# Patient Record
Sex: Female | Born: 1980 | Hispanic: No | Marital: Single | Smoking: Former smoker
Health system: Southern US, Community
[De-identification: ages and names within clinical notes are randomized; demographics above are authoritative.]

## PROBLEM LIST (undated history)

## (undated) ENCOUNTER — Inpatient Hospital Stay (HOSPITAL_COMMUNITY): Payer: Self-pay

## (undated) DIAGNOSIS — G43909 Migraine, unspecified, not intractable, without status migrainosus: Secondary | ICD-10-CM

## (undated) DIAGNOSIS — F419 Anxiety disorder, unspecified: Secondary | ICD-10-CM

## (undated) DIAGNOSIS — O009 Unspecified ectopic pregnancy without intrauterine pregnancy: Secondary | ICD-10-CM

## (undated) HISTORY — DX: Anxiety disorder, unspecified: F41.9

## (undated) HISTORY — PX: TONSILLECTOMY: SUR1361

## (undated) HISTORY — DX: Unspecified ectopic pregnancy without intrauterine pregnancy: O00.90

## (undated) HISTORY — DX: Migraine, unspecified, not intractable, without status migrainosus: G43.909

---

## 2002-01-08 ENCOUNTER — Emergency Department (HOSPITAL_COMMUNITY): Admission: EM | Admit: 2002-01-08 | Discharge: 2002-01-08 | Payer: Self-pay | Admitting: *Deleted

## 2002-01-08 ENCOUNTER — Encounter: Payer: Self-pay | Admitting: Emergency Medicine

## 2003-05-08 ENCOUNTER — Emergency Department (HOSPITAL_COMMUNITY): Admission: EM | Admit: 2003-05-08 | Discharge: 2003-05-08 | Payer: Self-pay | Admitting: Emergency Medicine

## 2003-05-12 ENCOUNTER — Encounter: Payer: Self-pay | Admitting: Emergency Medicine

## 2003-05-12 ENCOUNTER — Emergency Department (HOSPITAL_COMMUNITY): Admission: EM | Admit: 2003-05-12 | Discharge: 2003-05-12 | Payer: Self-pay | Admitting: Emergency Medicine

## 2004-08-21 ENCOUNTER — Ambulatory Visit (HOSPITAL_COMMUNITY): Admission: RE | Admit: 2004-08-21 | Discharge: 2004-08-21 | Payer: Self-pay | Admitting: Obstetrics and Gynecology

## 2005-08-29 ENCOUNTER — Ambulatory Visit: Payer: Self-pay | Admitting: Internal Medicine

## 2005-08-30 ENCOUNTER — Ambulatory Visit: Payer: Self-pay | Admitting: Internal Medicine

## 2005-08-30 ENCOUNTER — Ambulatory Visit (HOSPITAL_COMMUNITY): Admission: RE | Admit: 2005-08-30 | Discharge: 2005-08-30 | Payer: Self-pay | Admitting: Internal Medicine

## 2005-09-24 ENCOUNTER — Ambulatory Visit: Payer: Self-pay | Admitting: Internal Medicine

## 2005-09-26 ENCOUNTER — Ambulatory Visit (HOSPITAL_COMMUNITY): Admission: RE | Admit: 2005-09-26 | Discharge: 2005-09-26 | Payer: Self-pay | Admitting: Internal Medicine

## 2007-11-04 ENCOUNTER — Other Ambulatory Visit: Admission: RE | Admit: 2007-11-04 | Discharge: 2007-11-04 | Payer: Self-pay | Admitting: Obstetrics and Gynecology

## 2007-12-08 ENCOUNTER — Encounter (INDEPENDENT_AMBULATORY_CARE_PROVIDER_SITE_OTHER): Payer: Self-pay | Admitting: Otolaryngology

## 2007-12-08 ENCOUNTER — Ambulatory Visit (HOSPITAL_COMMUNITY): Admission: RE | Admit: 2007-12-08 | Discharge: 2007-12-08 | Payer: Self-pay | Admitting: Otolaryngology

## 2007-12-16 ENCOUNTER — Observation Stay (HOSPITAL_COMMUNITY): Admission: EM | Admit: 2007-12-16 | Discharge: 2007-12-17 | Payer: Self-pay | Admitting: Emergency Medicine

## 2008-06-22 ENCOUNTER — Emergency Department (HOSPITAL_COMMUNITY): Admission: EM | Admit: 2008-06-22 | Discharge: 2008-06-22 | Payer: Self-pay | Admitting: Emergency Medicine

## 2008-07-05 ENCOUNTER — Ambulatory Visit (HOSPITAL_COMMUNITY): Admission: RE | Admit: 2008-07-05 | Discharge: 2008-07-05 | Payer: Self-pay | Admitting: Pulmonary Disease

## 2008-07-08 ENCOUNTER — Encounter (HOSPITAL_COMMUNITY): Admission: RE | Admit: 2008-07-08 | Discharge: 2008-07-29 | Payer: Self-pay | Admitting: Pulmonary Disease

## 2008-07-14 ENCOUNTER — Encounter: Admission: RE | Admit: 2008-07-14 | Discharge: 2008-08-02 | Payer: Self-pay | Admitting: Pulmonary Disease

## 2008-08-03 ENCOUNTER — Encounter (HOSPITAL_COMMUNITY): Admission: RE | Admit: 2008-08-03 | Discharge: 2008-08-20 | Payer: Self-pay | Admitting: Pulmonary Disease

## 2008-11-30 ENCOUNTER — Other Ambulatory Visit: Admission: RE | Admit: 2008-11-30 | Discharge: 2008-11-30 | Payer: Self-pay | Admitting: Obstetrics and Gynecology

## 2009-03-16 ENCOUNTER — Ambulatory Visit (HOSPITAL_COMMUNITY): Payer: Self-pay | Admitting: Psychiatry
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
## 2010-01-11 ENCOUNTER — Emergency Department (HOSPITAL_COMMUNITY): Admission: EM | Admit: 2010-01-11 | Discharge: 2010-01-11 | Payer: Self-pay | Admitting: Family Medicine

## 2010-07-10 ENCOUNTER — Other Ambulatory Visit
Admission: RE | Admit: 2010-07-10 | Discharge: 2010-07-10 | Payer: Self-pay | Source: Home / Self Care | Admitting: Obstetrics and Gynecology

## 2010-12-12 NOTE — H&P (Signed)
NAME:  Monique Terry, Monique Terry                ACCOUNT NO.:  [id number]   MEDICAL RECORD NO.:  [id number]          PATIENT TYPE:  AMB   LOCATION:  SDS                          FACILITY:  [facility]   PHYSICIAN:  Hermelinda Medicus, M.D.   DATE OF BIRTH:  [birth year]   DATE OF ADMISSION:  12/08/2007  DATE OF DISCHARGE:                              HISTORY & PHYSICAL   HISTORY:  This patient is a 30 year old female who works at [employer].  She has been in people's homes.  She has had tonsillitis on several  occasions.  She has had tonsillitis times five in 2007 and times three  in 2008 and the cephalosporins and usual antibiotics worked very poorly  for her.  She now needs Levaquin to get this resolved and continues to  have difficulties with her tonsils with exudate and persistent sore  throats.  She now enters for a tonsillectomy under general endotracheal  anesthesia.  She has also been evaluated for Monospot, which has been  negative.  She also has an allergy to SULFA.  Otherwise, her general  medical health is excellent.  She has no cardiovascular, blood pressure,  bowel, bladder, or respiratory problems.   PHYSICAL EXAMINATION:  VITAL SIGNS:  Reveals a blood pressure of 118/81  with a pulse of 80.  HEENT:  The ears are clear.  The tympanic membranes are clear.  The oral  cavity shows considerable exudative tonsillitis.  The nasopharynx and  oropharynx are clear.  The larynx is clear.  True cords, false cords,  epiglottis, and base of tongue are clear.  True cord mobility, gag  reflex, __________ , EOMs, facial nerve are all symmetrical.  CHEST:  Clear.  No rales, rhonchi, or wheezes.  CARDIOVASCULAR:  __________  murmurs or gallops.   INITIAL DIAGNOSES:  Tonsillitis, history of working in healthcare,  history of exposure, and history of poor antibiotic response.           ______________________________  Hermelinda Medicus, M.D.     JC/MEDQ  D:  12/08/2007  T:  12/08/2007  Job:  [id number]   cc:    Ramon Dredge L. Juanetta Gosling, M.D.

## 2010-12-12 NOTE — Op Note (Signed)
NAME:  SAADIYA, WILFONG                ACCOUNT NO.:  [id number]   MEDICAL RECORD NO.:  [id number]          PATIENT TYPE:  OBV   LOCATION:  5151                         FACILITY:  [facility]   PHYSICIAN:  Hermelinda Medicus, M.D.   DATE OF BIRTH:  [birth year]   DATE OF PROCEDURE:  DATE OF DISCHARGE:                               OPERATIVE REPORT   PREOPERATIVE DIAGNOSIS:  Left superior 7days post tonsillectomy  tonsillar bleed.   POSTOPERATIVE DIAGNOSIS:  Left superior 7 days post tonsillectomy  tonsillar bleed.   OPERATION:  Evaluation of tonsillar bleed and electrocoagulation.  Cauterization of this tonsillar bleeder.   OPERATOR:  Hermelinda Medicus, MD   ANESTHESIA:  General endotracheal with Dr. Bedelia Person.   PROCEDURE:  The patient was placed in supine position.  Under general  endotracheal anesthesia, we did have a window of exposure, where there  was only minimal bleeding, and the patient's stomach was clear.  She was  intubated without difficulty, and no problem with aspiration.  Once she  was intubated, the patient was prepped and draped, and the tonsillar gag  was placed using the [facility] mouth gag, and the right tonsillar bed was  carefully evaluated and found to be in good condition.  The left  tonsillar bed inferiorly was perfectly normal, but on the superior  aspect of the tonsillar bed on the left, she had some clotted blood.  This was removed then revealing a bleeder that we grasped using a  tonsillar tenaculum and cauterized this using Bovie coagulation.  There  was no other bleeding issue.  Once that was completed, the remainder of  the tonsillar region was all cleared.  The patient's mouth was  irrigated. The stomach was again suctioned, and the patient was awakened  and tolerated the procedure well and was doing well postoperatively.  I  will keep her on 23-hour observation here in the hospital and then we  will follow her again in 5 days and 10 days, and 3 weeks and 6  weeks.           ______________________________  Hermelinda Medicus, M.D.     JC/MEDQ  D:  12/16/2007  T:  12/17/2007  Job:  [id number]   cc:   Ramon Dredge L. Juanetta Gosling, M.D.

## 2010-12-12 NOTE — H&P (Signed)
Monique Terry, Monique Terry                ACCOUNT NO.:  [id number]   MEDICAL RECORD NO.:  [id number]          PATIENT TYPE:  OBV   LOCATION:  5151                         FACILITY:  [facility]   PHYSICIAN:  Hermelinda Medicus, M.D.   DATE OF BIRTH:  1981/04/11   DATE OF ADMISSION:  12/16/2007  DATE OF DISCHARGE:                              HISTORY & PHYSICAL   HISTORY OF PRESENT ILLNESS:  This patient is a 30 year old female who  had had 5 episodes of tonsillitis in 2007 and has had 3 episodes of in  2008 and continues to have these difficulties with tonsillitis.  She  works for [employer] and is in people's homes and is exposed to  considerable amount of infections and bacteria.  She has exudative  tonsils and underwent a tonsillectomy on Dec 08, 2007, which was  uneventful.  She did very well and did well during and after her surgery  and was kept as an outpatient.  However, today, she apparently ate some  one time soup with some beef pieces in it and some ice cream and  suddenly had some bleeding from her tonsillar bed.  She went to the  emergency room and was noted that she had considerable bleeding from the  left superior pole region at [facility].  She was immediately  sent down to [facility] and immediately taken to the operating room  as she had considerable bleeding where she vomited up the food that she  had taken and also felt that she probably lost approximately 500 mL.   Her past history is quite unremarkable.   ALLERGIES:  SHE IS ALLERGIC TO SULFA.   She was evaluated in the past because she had severe tonsillitis.  Monospot was, however, negative and she is generally in good health.  Had no cardiovascular, blood pressure, bowel or bladder, or respiratory  problems.   PHYSICAL EXAMINATION:  VITAL SIGNS:  Her blood pressure was 120/70 when  she was here with a pulse of 140.  When she was up at [facility] she did  have an elevated blood pressure probably secondary to  her anxiety of  146/90.  GENERAL:  Oral cavity shows some bleeding.  She was spitting out some  blood, not a great deal, but she did have some that shows that she had  spit out.  HEENT:  Nose is clear.  Oral cavity is otherwise clear.  Her ears are  clear.  CHEST:  Clear.  No rales, rhonchi, or wheezes.  CARDIOVASCULAR:  Normal sinus rhythm.  No murmurs, rubs, or gallops.  ABDOMEN:  Unremarkable.  EXTREMITIES:  Unremarkable.   INITIAL DIAGNOSIS:  Status post tonsillectomy or tonsillitis with a left  superior pole tonsillar bleed.   PLAN:  Our plan is to go to the operating room and evaluate and correct  this tonsillar bleeding.           ______________________________  Hermelinda Medicus, M.D.     JC/MEDQ  D:  12/16/2007  T:  12/17/2007  Job:  [id number]   cc:   Ramon Dredge L. Juanetta Gosling, M.D.

## 2010-12-12 NOTE — Op Note (Signed)
NAME:  Monique Terry, Monique Terry                ACCOUNT NO.:  [id number]   MEDICAL RECORD NO.:  [id number]          PATIENT TYPE:  AMB   LOCATION:  SDS                          FACILITY:  [facility]   PHYSICIAN:  Hermelinda Medicus, M.D.   DATE OF BIRTH:  02-Oct-1980   DATE OF PROCEDURE:  DATE OF DISCHARGE:                               OPERATIVE REPORT   PREOPERATIVE DIAGNOSIS:  Tonsillitis.   POSTOPERATIVE DIAGNOSIS:  Tonsillitis.   OPERATION:  Tonsillectomy.   ANESTHESIA:  General endotracheal with Dr. Jean Rosenthal.   SURGEON:  Hermelinda Medicus, MD   PROCEDURE:  The patient was placed in supine position under general  endotracheal anesthesia.  The tonsils were removed using the Bovie  electrocoagulation and blunt dissection.  The tonsils were found to be  exudative and filled with purulent material.  The patient has been on  antibiotics and is IV antibiotics at this time.  Once the tonsils were  removed, all hemostasis was established with Bovie electrocoagulation.  Stomach was suctioned with the gag that was used with the [facility], and once the gag was slowly released, tonsillar beds were  again checked and found to be completely clear.   TOTAL BLOOD LOSS:  Estimated 20 mL.   FOLLOW-UP:  Her follow-up will be in 5 days then 10 days then 3 weeks,  and she is aware that she cannot travel for long distances for at least  10 days to 2 weeks.  She has to be on a soft bland diet for the same  amount of time.  The patient tolerated the procedure well and was doing  well postop.           ______________________________  Hermelinda Medicus, M.D.     JC/MEDQ  D:  12/08/2007  T:  12/08/2007  Job:  [id number]   cc:   Ramon Dredge L. Juanetta Gosling, M.D.

## 2010-12-15 NOTE — Consult Note (Signed)
NAME:  POPP, Makia                ACCOUNT NO.:  [id number]   MEDICAL RECORD NO.:  [id number]          PATIENT TYPE:  AMB   LOCATION:                                FACILITY:  [facility]   PHYSICIAN:  R. Roetta Sessions, M.D. DATE OF BIRTH:  [birth year]   DATE OF CONSULTATION:  DATE OF DISCHARGE:                                   CONSULTATION   REASON FOR CONSULTATION:  Chronic vomiting.   HISTORY OF PRESENT ILLNESS:  Mrs. Monique Terry is a 30 year old Caucasian female who  reports a 12-year history of intermittent chronic vomiting.  She states  approximately three months ago she had very frequent episodes of vomiting  without warning.  During the month of November and December, about 2/3 of  the month, she had a daily vomiting episode.  She also complains of a  globus sensation in the back of her throat.  She complains of post nasal  drip as well as sinus pressure.  She denies any symptoms of heartburn or  indigestion.  She does have rare water brash, especially when she eats out.  She denies any nausea prior to the vomiting.  Generally she vomits clear,  foamy emesis initially and is followed by 2-3 episodes of bilious material.  She denies any dysphagia or odynophagia.  Denies any anorexia or early  satiety.  She has had a one month trial of Prevacid 30 mg daily which did  not resolve her symptoms.  She has normal soft, brown bowel movements once  or twice a day.  Denies any rectal bleeding or melena.  Denies any history  of constipation or diarrhea.   PAST MEDICAL HISTORY:  Denies any.   CURRENT MEDICATIONS:  1.  [place] BC 777 once daily.  2.  One-A-Day multivitamin daily.  3.  Folic acid daily.   ALLERGIES:  SULFA.   FAMILY HISTORY:  Positive for paternal grandfather with colon carcinoma  diagnosed in his late 87's.  She has no first degree history.  Father, age  10, has history of coronary disease.  Mother is age 77 and is healthy.  She  has one healthy brother.   SOCIAL HISTORY:  Mrs.  Monique Terry has been married for two years.  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  She has a four year  history of smoking about half a pack a day.  She consumes about 8 beers a  month.  Denies any drug use.   REVIEW OF SYSTEMS:  CONSTITUTIONAL:  Weight stable.  Denies any fever or  chills.  Denies any fatigue.  Denies any insomnia.  HEENT:  She does report  post nasal drip as well as sinus pressure and headache.  CARDIOVASCULAR:  Denies any chest pain or palpitations. PULMONOLOGY:  Denies any cough,  shortness of breath, dyspnea or hemoptysis.  GI:  See HPI.  GYN:  Her last  menstrual period was approximately 28 days ago.  She does have regular  cycles on birth control pills.  GU:  She denies any dysuria, hematuria or  increased urinary frequency.  NEUROLOGICAL:  She does have frequent  headaches that are temporal and occipital in nature.  She can have headaches  that last a couple of days followed by a month without headaches.  She  denies any visual changes or aura.  She does have some nausea with her  headaches, but generally does not vomiting.  She denies any weakness or  paresthesias.   PHYSICAL EXAMINATION:  VITAL SIGNS:  Weight 204 pounds, height 63-1/2  inches, temperature 98.8, blood pressure 110/76, pulse 92.  GENERAL APPEARANCE:  Mrs. Monique Terry is a 30 year old, obese, Caucasian female who  is alert, oriented, pleasant and cooperative in no acute distress.  HEENT:  Sclerae are clear, nonicteric.  Conjunctivae are pink.  Oropharynx  pink and moist without any lesions.  NECK:  Supple without any mass or thyromegaly.  CHEST:  Heart regular rate and rhythm with normal S1, S2 without any  murmurs, clicks, rubs or gallops.  LUNGS:  Clear to auscultation bilaterally.  ABDOMEN:  Protuberant with positive bowel sounds x4.  She does have an  umbilical ornament.  Abdomen soft, nontender, nondistended with no palpable  mass or hepatosplenomegaly.  No rebound tenderness  or guarding.  RECTAL:  No external lesions visualized.  Good sphincter tone.  No internal  masses palpated.  Small amount of light brown stool was obtained in the  vault.  It was hemoccult negative.  EXTREMITIES:  Without clubbing or edema bilaterally.  SKIN:  Pink, warm and dry without any rash or jaundice.   IMPRESSION:  Mrs. Monique Terry is a 30 year old Caucasian female with a 12-year  history of chronic intermittent vomiting.  Her symptoms were worse around  the holiday.  She reports rare water brash, but denies any chronic heartburn  or indigestion.  She does have a globus sensation as well as a significant  amount of post nasal drip.  She generally has no forewarning prior to emesis  which is generally clear and foamy to begin with followed by bilious  material.  Mrs. Monique Terry may have silent reflux, although, trial of PPI did not  make much difference.  She could also have peptic ulcer disease, and  therefore, I feel she needs further evaluation of her upper GI tract.  She  also notes a significant amount of sinus pressure and post nasal drip which  is associated with her vomiting, so this could very well be related to  allergic rhinitis as well.   PLAN:  1.  We will proceed with EGD with Dr. Jena Gauss in the near future.  I have      discussed the procedure including risks and benefits which include but      are not limited to bleeding, infection, perforation, drug reaction.  She      agrees with plan and consent will be obtained.  2.  Trial of Allegra 180 mg daily #30 with two refills.  3.  Further recommendations pending procedure.   I would like to thank Dr. Juanetta Gosling for allowing [place] to participate in the care  of Mrs. Popp.      Nicholas Lose, N.P.      Jonathon Bellows, M.D.  Electronically Signed    KC/MEDQ  D:  08/29/2005  T:  08/29/2005  Job:  [id number]   cc:   Ramon Dredge L. Juanetta Gosling, M.D.  Fax: [phone]

## 2011-04-25 LAB — DIFFERENTIAL
Basophils Absolute: 0.1
Basophils Relative: 1
Eosinophils Absolute: 0.3
Eosinophils Relative: 2
Lymphocytes Relative: 27
Lymphs Abs: 3.3
Monocytes Absolute: 0.8
Monocytes Relative: 7
Neutro Abs: 7.9 — ABNORMAL HIGH
Neutrophils Relative %: 64

## 2011-04-25 LAB — CBC
HCT: 37.3
Hemoglobin: 13.4
MCHC: 35.8
MCV: 88.1
Platelets: 317
RBC: 4.24
RDW: 12.6
WBC: 12.3 — ABNORMAL HIGH

## 2011-07-13 ENCOUNTER — Other Ambulatory Visit (HOSPITAL_COMMUNITY)
Admission: RE | Admit: 2011-07-13 | Discharge: 2011-07-13 | Disposition: A | Discharge status: Home / Self Care | Payer: BC Managed Care – PPO | Source: Ambulatory Visit | Attending: Obstetrics & Gynecology | Admitting: Obstetrics & Gynecology

## 2011-07-13 ENCOUNTER — Other Ambulatory Visit: Payer: Self-pay | Admitting: Obstetrics & Gynecology

## 2011-07-13 DIAGNOSIS — Z01419 Encounter for gynecological examination (general) (routine) without abnormal findings: Secondary | ICD-10-CM | POA: Insufficient documentation

## 2016-06-05 ENCOUNTER — Encounter (HOSPITAL_COMMUNITY): Payer: Self-pay | Admitting: Emergency Medicine

## 2016-06-05 ENCOUNTER — Emergency Department (HOSPITAL_COMMUNITY)
Admission: EM | Admit: 2016-06-05 | Discharge: 2016-06-05 | Disposition: A | Discharge status: Home / Self Care | Payer: Self-pay | Attending: Emergency Medicine | Admitting: Emergency Medicine

## 2016-06-05 DIAGNOSIS — R21 Rash and other nonspecific skin eruption: Secondary | ICD-10-CM | POA: Insufficient documentation

## 2016-06-05 DIAGNOSIS — Z79899 Other long term (current) drug therapy: Secondary | ICD-10-CM | POA: Insufficient documentation

## 2016-06-05 DIAGNOSIS — F17210 Nicotine dependence, cigarettes, uncomplicated: Secondary | ICD-10-CM | POA: Insufficient documentation

## 2016-06-05 MED ORDER — PREDNISONE 10 MG (21) PO TBPK: 10.0000 mg | ORAL_TABLET | Freq: Every day | ORAL | 0 refills | Status: DC

## 2016-06-05 MED ORDER — HYDROXYZINE HCL 10 MG PO TABS: 10.0000 mg | ORAL_TABLET | Freq: Four times a day (QID) | ORAL | 0 refills | Status: DC | PRN

## 2016-06-05 MED ORDER — TRIAMCINOLONE ACETONIDE 0.1 % EX CREA: 1.0000 "application " | TOPICAL_CREAM | Freq: Two times a day (BID) | CUTANEOUS | 1 refills | Status: DC

## 2016-06-05 NOTE — ED Provider Notes (Signed)
[facility]-EMERGENCY DEPT Provider Note   CSN: [id number] Arrival date & time: 06/05/16  1754     History   Chief Complaint Chief Complaint  Patient presents with  . Rash    HPI Monique Terry is a 35 y.o. female.  Monique Terry is a 35 y.o. Female who presents to the ED with concern for a poison oak rash. Patient reports 2 weeks ago she started cutting down some trees that had some poison oak around them. She started to develop a rash and used poison oak soap. She then finished cutting on the trees again 1 week ago and noticed more of her rash. She reports the rash had linear streaks to her bilateral arms. She also noticed some new areas that gradually started days later. She reports today she noted a red area that's itchy to her right eyebrow and to her left neck. No eye involvement. She does not wear contacts or glasses.  She reports she's been using poison ivy soap and calamine lotion with little relief. She reports the rash is itchy. She reports the rash to her arms is improving. She denies fevers, tongue swelling, lip swelling, trouble breathing, discharge, abdominal pain, or other rashes.    The history is provided by the patient. No language interpreter was used.  Rash      History reviewed. No pertinent past medical history.  There are no active problems to display for this patient.   Past Surgical History:  Procedure Laterality Date  . TONSILLECTOMY      OB History    Gravida Para Term Preterm AB Living   0 0 0 0 0 0   SAB TAB Ectopic Multiple Live Births   0 0 0 0 0       Home Medications    Prior to Admission medications   Medication Sig Start Date End Date Taking? Authorizing Provider  hydrOXYzine (ATARAX/VISTARIL) 10 MG tablet Take 1 tablet (10 mg total) by mouth every 6 (six) hours as needed for itching. 06/05/16   Everlene Farrier, PA-C  predniSONE (STERAPRED UNI-PAK 21 TAB) 10 MG (21) TBPK tablet Take 1 tablet (10 mg total) by mouth daily. Take 6 tabs  by mouth daily  for 2 days, then 5 tabs for 2 days, then 4 tabs for 2 days, then 3 tabs for 2 days, 2 tabs for 2 days, then 1 tab by mouth daily for 2 days 06/05/16   Everlene Farrier, PA-C  triamcinolone cream (KENALOG) 0.1 % Apply 1 application topically 2 (two) times daily. 06/05/16   Everlene Farrier, PA-C    Family History History reviewed. No pertinent family history.  Social History Social History  Substance Use Topics  . Smoking status: Current Every Day Smoker    Packs/day: 0.50    Types: Cigarettes  . Smokeless tobacco: Never Used  . Alcohol use Yes     Comment: occ     Allergies   Sulfa antibiotics   Review of Systems Review of Systems  Constitutional: Negative for chills and fever.  HENT: Negative for congestion, ear pain, facial swelling, rhinorrhea, sore throat and trouble swallowing.   Eyes: Negative for pain and visual disturbance.  Respiratory: Negative for cough and shortness of breath.   Gastrointestinal: Negative for nausea and vomiting.  Musculoskeletal: Negative for myalgias.  Skin: Positive for rash.     Physical Exam Updated Vital Signs BP 137/80   Pulse 79   Temp 98.6 F (37 C) (Oral)   Resp 16  Ht 5\' 3"  (1.6 m)   Wt 78 kg   LMP 05/08/2016   SpO2 100%   BMI 30.47 kg/m   Physical Exam  Constitutional: She is oriented to person, place, and time. She appears well-developed and well-nourished. No distress.  Nontoxic appearing.  HENT:  Head: Normocephalic and atraumatic.  Right Ear: External ear normal.  Left Ear: External ear normal.  Mouth/Throat: Oropharynx is clear and moist. No oropharyngeal exudate.  Slight area of erythema to her right eyebrow. No vesicles or bulla.  No tongue or lip swelling. Throat is clear.  Bilateral tympanic membranes are pearly-gray without erythema or loss of landmarks.   Eyes: Conjunctivae and EOM are normal. Pupils are equal, round, and reactive to light. Right eye exhibits no discharge. Left eye exhibits no  discharge.  Neck: Normal range of motion. Neck supple. No JVD present. No tracheal deviation present.  Cardiovascular: Normal rate, regular rhythm and intact distal pulses.   Pulmonary/Chest: Effort normal. No stridor. No respiratory distress.  Lymphadenopathy:    She has no cervical adenopathy.  Neurological: She is alert and oriented to person, place, and time. Coordination normal.  Skin: Skin is warm and dry. Capillary refill takes less than 2 seconds. Rash noted. She is not diaphoretic. There is erythema. No pallor.  Slight area of erythema noted to her bilateral arms. Some with linear marks. No vesicles or bulla. No evidence of secondary infection. No discharge.   Psychiatric: She has a normal mood and affect. Her behavior is normal.  Nursing note and vitals reviewed.    ED Treatments / Results  Labs (all labs ordered are listed, but only abnormal results are displayed) Labs Reviewed - No data to display  EKG  EKG Interpretation None       Radiology No results found.  Procedures Procedures (including critical care time)  Medications Ordered in ED Medications - No data to display   Initial Impression / Assessment and Plan / ED Course  I have reviewed the triage vital signs and the nursing notes.  Pertinent labs & imaging results that were available during my care of the patient were reviewed by me and considered in my medical decision making (see chart for details).  Clinical Course    This is a 35 y.o. Female who presents to the ED with concern for a poison oak rash. Patient reports 2 weeks ago she started cutting down some trees that had some poison oak around them. She started to develop a rash and used poison oak soap. She then finished cutting on the trees again 1 week ago and noticed more of her rash. She reports the rash had linear streaks to her bilateral arms. She also noticed some new areas that gradually started days later. She reports today she noted a red  area that's itchy to her right eyebrow and to her left neck. No eye involvement. She does not wear contacts or glasses.  She reports she's been using poison ivy soap and calamine lotion with little relief. She reports the rash is itchy.  On exam the patient is afebrile and non-toxic appearing. She has a slight area of erythema to her right eyebrow where it appears she was scratching the area. No eye involvement. There is also some slight erythema noted to her bilateral arms some in a linear pattern. No evidence of infection. No evidence of abscess or cellulitis.  Patient does describe a history that is convincing for possible poison oak exposure and rash. I discussed the  expected course and treatment of poison oak. Will start on steroid cream, steroid taper and Atarax for itching. I discussed return precautions. I advised the patient to follow-up with their primary care provider this week. I advised the patient to return to the emergency department with new or worsening symptoms or new concerns. The patient verbalized understanding and agreement with plan.    Final Clinical Impressions(s) / ED Diagnoses   Final diagnoses:  Rash and nonspecific skin eruption    New Prescriptions New Prescriptions   HYDROXYZINE (ATARAX/VISTARIL) 10 MG TABLET    Take 1 tablet (10 mg total) by mouth every 6 (six) hours as needed for itching.   PREDNISONE (STERAPRED UNI-PAK 21 TAB) 10 MG (21) TBPK TABLET    Take 1 tablet (10 mg total) by mouth daily. Take 6 tabs by mouth daily  for 2 days, then 5 tabs for 2 days, then 4 tabs for 2 days, then 3 tabs for 2 days, 2 tabs for 2 days, then 1 tab by mouth daily for 2 days   TRIAMCINOLONE CREAM (KENALOG) 0.1 %    Apply 1 application topically 2 (two) times daily.     Everlene FarrierWilliam Eloyce Bultman, PA-C 06/05/16 1909    Mancel BaleElliott Wentz, MD 06/06/16 [phone]

## 2016-06-05 NOTE — ED Triage Notes (Signed)
PT c/o rash to face, both arms, neck and back x2 weeks and states it started after cutting down a tree that may have had poison oak on it. PT states no relief from OTC soap for poison ivy.

## 2017-05-29 ENCOUNTER — Telehealth: Payer: Self-pay | Admitting: *Deleted

## 2017-05-29 ENCOUNTER — Telehealth: Payer: Self-pay | Admitting: Obstetrics & Gynecology

## 2017-05-29 NOTE — Telephone Encounter (Signed)
Informed patient that dosage for mag oxide 400-600mg  daily. Verbalized understanding.

## 2017-05-29 NOTE — Telephone Encounter (Signed)
Patient called stating she has a history of migraines and has taken extra strength Tylenol with no relief. She has also tried a Coke per day as well. Encouraged to try eating small frequent meals and push fluids. Also could try CoQ10 100mg  3 times daily, Vit B2 400mg  daily and Mag Oxided 400-600mg  daily. Pt stated she would try these suggestions.

## 2017-05-30 ENCOUNTER — Other Ambulatory Visit: Payer: Self-pay | Admitting: Obstetrics and Gynecology

## 2017-05-30 DIAGNOSIS — O3680X0 Pregnancy with inconclusive fetal viability, not applicable or unspecified: Secondary | ICD-10-CM

## 2017-05-31 ENCOUNTER — Encounter: Payer: Self-pay | Admitting: Obstetrics and Gynecology

## 2017-05-31 ENCOUNTER — Other Ambulatory Visit: Payer: Self-pay | Admitting: Obstetrics and Gynecology

## 2017-05-31 ENCOUNTER — Encounter (INDEPENDENT_AMBULATORY_CARE_PROVIDER_SITE_OTHER): Payer: Self-pay

## 2017-05-31 ENCOUNTER — Ambulatory Visit (INDEPENDENT_AMBULATORY_CARE_PROVIDER_SITE_OTHER): Payer: Medicaid Other

## 2017-05-31 ENCOUNTER — Ambulatory Visit (INDEPENDENT_AMBULATORY_CARE_PROVIDER_SITE_OTHER): Payer: Medicaid Other | Admitting: Obstetrics and Gynecology

## 2017-05-31 VITALS — BP 112/74 | HR 84 | Ht 63.0 in | Wt 174.6 lb

## 2017-05-31 DIAGNOSIS — Z3A01 Less than 8 weeks gestation of pregnancy: Secondary | ICD-10-CM | POA: Diagnosis not present

## 2017-05-31 DIAGNOSIS — O3680X0 Pregnancy with inconclusive fetal viability, not applicable or unspecified: Secondary | ICD-10-CM

## 2017-05-31 DIAGNOSIS — R109 Unspecified abdominal pain: Secondary | ICD-10-CM

## 2017-05-31 DIAGNOSIS — O26851 Spotting complicating pregnancy, first trimester: Secondary | ICD-10-CM

## 2017-05-31 LAB — COMPREHENSIVE METABOLIC PANEL
A/G RATIO: 1.7 (ref 1.2–2.2)
ALBUMIN: 4.3 g/dL (ref 3.5–5.5)
ALK PHOS: 53 IU/L (ref 39–117)
ALT: 19 IU/L (ref 0–32)
AST: 17 IU/L (ref 0–40)
BILIRUBIN TOTAL: 0.3 mg/dL (ref 0.0–1.2)
BUN / CREAT RATIO: 13 (ref 9–23)
BUN: 8 mg/dL (ref 6–20)
CO2: 22 mmol/L (ref 20–29)
CREATININE: 0.6 mg/dL (ref 0.57–1.00)
Calcium: 9.4 mg/dL (ref 8.7–10.2)
Chloride: 106 mmol/L (ref 96–106)
GFR calc Af Amer: 138 mL/min/{1.73_m2} (ref >59)
GFR calc non Af Amer: 119 mL/min/{1.73_m2} (ref >59)
GLOBULIN, TOTAL: 2.6 g/dL (ref 1.5–4.5)
Glucose: 109 mg/dL — ABNORMAL HIGH (ref 65–99)
POTASSIUM: 4.6 mmol/L (ref 3.5–5.2)
SODIUM: 138 mmol/L (ref 134–144)
Total Protein: 6.9 g/dL (ref 6.0–8.5)

## 2017-05-31 LAB — CBC WITH DIFFERENTIAL/PLATELET
BASOS: 0 %
Basophils Absolute: 0 10*3/uL (ref 0.0–0.2)
EOS (ABSOLUTE): 0.2 10*3/uL (ref 0.0–0.4)
EOS: 2 %
HEMATOCRIT: 39.7 % (ref 34.0–46.6)
HEMOGLOBIN: 13.8 g/dL (ref 11.1–15.9)
LYMPHS: 36 %
Lymphocytes Absolute: 4 10*3/uL — ABNORMAL HIGH (ref 0.7–3.1)
MCH: 32.7 pg (ref 26.6–33.0)
MCHC: 34.8 g/dL (ref 31.5–35.7)
MCV: 94 fL (ref 79–97)
Monocytes Absolute: 0.6 10*3/uL (ref 0.1–0.9)
Monocytes: 5 %
NEUTROS ABS: 6.2 10*3/uL (ref 1.4–7.0)
NEUTROS PCT: 57 %
Platelets: 288 10*3/uL (ref 150–379)
RBC: 4.22 x10E6/uL (ref 3.77–5.28)
RDW: 12.4 % (ref 12.3–15.4)
WBC: 11 10*3/uL — ABNORMAL HIGH (ref 3.4–10.8)

## 2017-05-31 LAB — BETA HCG QUANT (REF LAB): HCG QUANT: 3307 m[IU]/mL

## 2017-05-31 NOTE — Progress Notes (Signed)
[facility] Visit  05/31/2017            Patient name: Monique Terry MRN [id number]  Date of birth: 01/09/1981  CC & HPI:  Monique Terry is a 36 y.o. female presenting today for light abdominal cramping with onset of 1 week. Pt has has a positive pregnancy week, and has had an U/S with Amber in office today. She has associated symptoms of brown vaginal spotting, and strong headaches. Pt denies any pain. No alleviating factors noted. Pt has tried Tylenol extra strength medication for little headache relief. She states her last period was three days late, and began on 04/14/17. She took a pregnancy test on 05/13/17, which was negative. She took another one on 05/15/17, which showed a faded positive, and again on 05/18/17, which showed a full positive. Pt has been with [facility] for 4 months. This is pt's first pregnancy.  FOB accompanied pt. Pt is attempting to stop smoking.   ROS:  ROS  (+) light abdominal cramping (+) brown vaginal spotting (+) strong headache (-) fever All systems are negative except as noted in the HPI and PMH.    Pertinent History Reviewed:   Reviewed: Medical        No past medical history on file.                            Surgical Hx:    Past Surgical History:  Procedure Laterality Date   TONSILLECTOMY     Medications: Reviewed & Updated - see associated section                       Current Outpatient Prescriptions:    hydrOXYzine (ATARAX/VISTARIL) 10 MG tablet, Take 1 tablet (10 mg total) by mouth every 6 (six) hours as needed for itching., Disp: 30 tablet, Rfl: 0   predniSONE (STERAPRED UNI-PAK 21 TAB) 10 MG (21) TBPK tablet, Take 1 tablet (10 mg total) by mouth daily. Take 6 tabs by mouth daily  for 2 days, then 5 tabs for 2 days, then 4 tabs for 2 days, then 3 tabs for 2 days, 2 tabs for 2 days, then 1 tab by mouth daily for 2 days, Disp: 42 tablet, Rfl: 0   triamcinolone cream (KENALOG) 0.1 %, Apply 1 application topically 2 (two) times  daily., Disp: 45 g, Rfl: 1   Social History: Reviewed -  reports that she has been smoking Cigarettes.  She has been smoking about 0.50 packs per day. She has never used smokeless tobacco.  Objective Findings:  Vitals: Blood pressure 112/74, pulse 84, height 5\' 3"  (1.6 m), weight 174 lb 9.6 oz (79.2 kg), last menstrual period 03/14/2017.  Physical Examination: General appearance - alert, well appearing, and in no distress Mental status - alert, oriented to person, place, and time Pelvic - Not indicated  U/S results 05/31/17: [place] TA/TV:no IUP visualized,right adnexal mass 1.9 x 1.1 x 1.4 cm, small amount of simple right adnexal fluid,right corpus luteum 1.8 x 1.7 x 1.8 cm,normal left ovary  CBC    Component Value Date/Time   WBC 11.0 (H) 05/31/2017 1003   WBC 12.3 (H) 12/16/2007 1415   RBC 4.22 05/31/2017 1003   RBC 4.24 12/16/2007 1415   HGB 13.8 05/31/2017 1003   HCT 39.7 05/31/2017 1003   PLT 288 05/31/2017 1003   MCV 94 05/31/2017 1003   MCH 32.7 05/31/2017 1003  MCHC 34.8 05/31/2017 1003   MCHC 35.8 12/16/2007 1415   RDW 12.4 05/31/2017 1003   LYMPHSABS 4.0 (H) 05/31/2017 1003   MONOABS 0.8 12/16/2007 1415   EOSABS 0.2 05/31/2017 1003   BASOSABS 0.0 05/31/2017 1003   CMP Latest Ref Rng & Units 05/31/2017  Glucose 65 - 99 mg/dL 161(W109(H)  BUN 6 - 20 mg/dL 8  Creatinine 9.600.57 - 4.541.00 mg/dL 0.980.60  Sodium 119134 - 147144 mmol/L 138  Potassium 3.5 - 5.2 mmol/L 4.6  Chloride 96 - 106 mmol/L 106  CO2 20 - 29 mmol/L 22  Calcium 8.7 - 10.2 mg/dL 9.4  Total Protein 6.0 - 8.5 g/dL 6.9  Total Bilirubin 0.0 - 1.2 mg/dL 0.3  Alkaline Phos 39 - 117 IU/L 53  AST 0 - 40 IU/L 17  ALT 0 - 32 IU/L 19    Qhcg 3,307, near upper limits descrimination zone.   Discussion: 1. Discussed Pt's Ultrasound images that were taken today. The possibility of either an ectopic pregnancy, or a slow start to pt's pregnancy was discussed. Blood work process explained to pt as well.  At end of discussion, pt  had opportunity to ask questions and has no further questions at this time.   Specific discussion as noted above. Greater than 50% was spent in counseling and coordination of care with the patient.   Total time greater than: 25 minutes.    Assessment & Plan:   A:  1.Pregnancy of unknown location   P:  1. CBC, Cmet. and Quant HCG, pt advised not to have any sexual activity 2. f/u on Monday for second blood work: repeat Quant HCG serum Addendum: pt to report to MAU Sunday 8 am for repeat Qhcg and u/s ,   By signing my name below, I, Izna Ahmed, attest that this documentation has been prepared under the direction and in the presence of Tilda BurrowFerguson, John V, MD. Electronically Signed: Redge GainerIzna Ahmed, Medical Scribe. 05/31/17. 9:09 AM.  I personally performed the services described in this documentation, which was SCRIBED in my presence. The recorded information has been reviewed and considered accurate. It has been edited as necessary during review. Tilda BurrowFERGUSON,JOHN V, MD

## 2017-05-31 NOTE — Progress Notes (Signed)
US TA/TV:no IUP visualized,right adnexal mass 1.9 x 1.1 x 1.4 cm, small amount of simple right adnexal fluid,right corpus luteum 1.8 x 1.7 x 1.8 cm,normal left ovary

## 2017-06-01 ENCOUNTER — Telehealth: Payer: Self-pay | Admitting: Obstetrics and Gynecology

## 2017-06-01 NOTE — Telephone Encounter (Signed)
Pt informed of the lab results, and QHCG at upper limits descrimination zone. Pt having no pain to date.  Pt will report to MAU 8 am. for repeat HCG and u/s tomorrow, pt told that results tomorrow should allow diagnosis.  MAU notified

## 2017-06-02 ENCOUNTER — Inpatient Hospital Stay (HOSPITAL_COMMUNITY): Payer: Medicaid Other

## 2017-06-02 ENCOUNTER — Inpatient Hospital Stay (HOSPITAL_COMMUNITY)
Admission: AD | Admit: 2017-06-02 | Discharge: 2017-06-02 | Disposition: A | Discharge status: Home / Self Care | Payer: Medicaid Other | Source: Ambulatory Visit | Attending: Obstetrics and Gynecology | Admitting: Obstetrics and Gynecology

## 2017-06-02 ENCOUNTER — Encounter (HOSPITAL_COMMUNITY): Payer: Self-pay | Admitting: *Deleted

## 2017-06-02 DIAGNOSIS — O26899 Other specified pregnancy related conditions, unspecified trimester: Secondary | ICD-10-CM

## 2017-06-02 DIAGNOSIS — O00101 Right tubal pregnancy without intrauterine pregnancy: Secondary | ICD-10-CM | POA: Diagnosis not present

## 2017-06-02 DIAGNOSIS — F17210 Nicotine dependence, cigarettes, uncomplicated: Secondary | ICD-10-CM | POA: Insufficient documentation

## 2017-06-02 DIAGNOSIS — R9389 Abnormal findings on diagnostic imaging of other specified body structures: Secondary | ICD-10-CM | POA: Insufficient documentation

## 2017-06-02 DIAGNOSIS — R102 Pelvic and perineal pain: Secondary | ICD-10-CM | POA: Diagnosis present

## 2017-06-02 DIAGNOSIS — O99331 Smoking (tobacco) complicating pregnancy, first trimester: Secondary | ICD-10-CM | POA: Diagnosis not present

## 2017-06-02 DIAGNOSIS — Z635 Disruption of family by separation and divorce: Secondary | ICD-10-CM | POA: Diagnosis not present

## 2017-06-02 DIAGNOSIS — O26891 Other specified pregnancy related conditions, first trimester: Secondary | ICD-10-CM | POA: Diagnosis present

## 2017-06-02 LAB — HCG, QUANTITATIVE, PREGNANCY: HCG, BETA CHAIN, QUANT, S: 6317 m[IU]/mL — AB (ref <5)

## 2017-06-02 LAB — TYPE AND SCREEN
ABO/RH(D): A POS
Antibody Screen: NEGATIVE

## 2017-06-02 LAB — ABO/RH: ABO/RH(D): A POS

## 2017-06-02 MED ORDER — METHOTREXATE INJECTION FOR WOMEN'S HOSPITAL
50.0000 mg/m2 | Freq: Once | INTRAMUSCULAR | Status: AC
  Administered 2017-06-02: 95 mg via INTRAMUSCULAR
  Filled 2017-06-02: qty 1.9

## 2017-06-02 NOTE — Discharge Instructions (Signed)
Methotrexate Treatment for an Ectopic Pregnancy, Care After °Refer to this sheet in the next few weeks. These instructions provide you with information on caring for yourself after your procedure. Your health care provider may also give you more specific instructions. Your treatment has been planned according to current medical practices, but problems sometimes occur. Call your health care provider if you have any problems or questions after your procedure. °What can I expect after the procedure? °You may have some abdominal cramping, vaginal bleeding, and fatigue in the first few days after taking methotrexate. Some other possible side effects of methotrexate include: °· Nausea. °· Vomiting. °· Diarrhea. °· Mouth sores. °· Swelling or irritation of the lining of your lungs (pneumonitis). °· Liver damage. °· Hair loss. ° °Follow these instructions at home: °After you have received the methotrexate medicine, you need to be careful of your activities and watch your condition for several weeks. It may take 1 week before your hormone levels return to normal. °Activity °· Do not have sexual intercourse until your health care provider says it is safe to do so. °· You may resume your usual diet. °· Limit strenuous activity. °· Do not drink alcohol. °General instructions °· Do not take aspirin, ibuprofen, or naproxen (nonsteroidal anti-inflammatory drugs [NSAIDs]). °· Do not take folic acid, prenatal vitamins, or other vitamins that contain folic acid. °· Avoid traveling too far away from your health care provider. °· Keep all follow-up visits as told by your health care provider. This is important. °Contact a health care provider if: °· You cannot control your nausea and vomiting. °· You cannot control your diarrhea. °· You have sores in your mouth and want treatment. °· You need pain medicine for your abdominal pain. °· You have a rash. °· You are having a reaction to the medicine. °Get help right away if: °· You have  increasing abdominal or pelvic pain. °· You notice increased bleeding. °· You feel light-headed, or you faint. °· You have shortness of breath. °· Your heart rate increases. °· You have a cough. °· You have chills. °· You have a fever. °This information is not intended to replace advice given to you by your health care provider. Make sure you discuss any questions you have with your health care provider. °Document Released: 07/05/2011 Document Revised: 12/22/2015 Document Reviewed: 05/04/2013 °Elsevier Interactive Patient Education © 2017 Elsevier Inc. ° °

## 2017-06-02 NOTE — MAU Note (Signed)
Pt instructed to come to MAU by Dr. Emelda FearFerguson for evaluation for ectopic.  Had U/[facility], couldn't see IUP.

## 2017-06-02 NOTE — MAU Provider Note (Signed)
[facility] Attending [facility]U Note  Chief Complaint: Follow-up    First Provider Initiated Contact with Patient 06/02/17 1120      SUBJECTIVE Lilyan GilfordJessica L Quadros is a 36 y.o. G2P0010 at [id number] by LMP who [facility] 3300 and no IUP. Repeat BHCG today is 6300 and u/s shows suspicious right ectopic pregnancy no IUP.  History reviewed. No pertinent past medical history. OB History  Gravida Para Term Preterm AB Living  2 0 0 0 1 0  SAB TAB Ectopic Multiple Live Births  0 0 1 0 0    # Outcome Date GA Lbr Len/2nd Weight Sex Delivery Anes PTL Lv  2 Current           1 Ectopic              Past Surgical History:  Procedure Laterality Date  . TONSILLECTOMY     Social History   Socioeconomic History  . Marital status: Legally Separated    Spouse name: Not on file  . Number of children: Not on file  . Years of education: Not on file  . Highest education level: Not on file  Social Needs  . Financial resource strain: Not on file  . Food insecurity - worry: Not on file  . Food insecurity - inability: Not on file  . Transportation needs - medical: Not on file  . Transportation needs - non-medical: Not on file  Occupational History  . Not on file  Tobacco Use  . Smoking status: Current Every Day Smoker    Packs/day: 0.25    Types: Cigarettes  . Smokeless tobacco: Never Used  Substance and Sexual Activity  . Alcohol use: No    Comment: occ  . Drug use: No  . Sexual activity: Yes    Birth control/protection: None  Other Topics Concern  . Not on file  Social History Narrative  . Not on file   No current facility-administered medications on file prior to encounter.    Current Outpatient Medications on File Prior to Encounter  Medication Sig Dispense Refill  . Coenzyme Q10 (CO Q10) 100 MG CAPS Take by mouth.    . hydrOXYzine (ATARAX/VISTARIL) 10 MG tablet Take 1 tablet (10 mg total) by mouth every 6 (six) hours as needed  for itching. (Patient not taking: Reported on 05/31/2017) 30 tablet 0  . magnesium oxide (MAG-OX) 400 MG tablet Take 400 mg by mouth daily.    . Prenatal Vit-Fe Fumarate-FA (MULTIVITAMIN-PRENATAL) 27-0.8 MG TABS tablet Take 1 tablet by mouth daily at 12 noon.    . riboflavin (VITAMIN B-2) 100 MG TABS tablet Take 400 mg by mouth daily.    Marland Kitchen. triamcinolone cream (KENALOG) 0.1 % Apply 1 application topically 2 (two) times daily. (Patient not taking: Reported on 05/31/2017) 45 g 1   Allergies  Allergen Reactions  . Sulfa Antibiotics Other (See Comments)    Other     ROS: Pertinent items in HPI  OBJECTIVE BP 124/77 (BP Location: Left Arm)   Pulse 89   Temp 98.3 F (36.8 C) (Oral)   Resp 18   Ht 5\' 3"  (1.6 m)   Wt 175 lb (79.4 kg)   LMP 04/14/2017   BMI 31.00 kg/m  CONSTITUTIONAL: Well-developed, well-nourished female in no acute distress.  HENT:  Normocephalic, atraumatic,Oropharynx is clear and moist EYES: . No scleral icterus.  NECK: Normal range of motion, supple  SKIN: Skin is warm and dry.  No rash noted. NEUROLGIC: Alert and oriented to person, place, and time.  PSYCHIATRIC: Normal mood and affect. Normal behavior. Normal judgment and thought content. CARDIOVASCULAR: Normal heart rate noted RESPIRATORY: Effort normal. ABDOMEN: Soft, normal bowel sounds, no distention noted.  No tenderness, rebound or guarding.  MUSCULOSKELETAL: Normal range of motion. No tenderness.    LAB RESULTS Results for orders placed or performed during the hospital encounter of 06/02/17 (from the past 48 hour(s))  hCG, quantitative, pregnancy     Status: Abnormal   Collection Time: 06/02/17  8:25 AM  Result Value Ref Range   hCG, Beta Chain, Quant, S 6,317 (H) <5 mIU/mL    Comment:          GEST. AGE      CONC.  (mIU/mL)   <=1 WEEK        5 - 50     2 WEEKS       50 - 500     3 WEEKS       100 - 10,000     4 WEEKS     1,000 - 30,000     5 WEEKS     3,500 - 115,000   6-8 WEEKS     12,000 -  270,000    12 WEEKS     15,000 - 220,000        FEMALE AND NON-PREGNANT FEMALE:     LESS THAN 5 mIU/mL    Blood Type A pos  IMAGING US Ob Comp Less 14 Wks  Result Date: 06/02/2017 CLINICAL DATA:  36 year old female with questionable ectopic on recent in office ultrasound. Patient's LMP was 04/14/2017 corresponding to a gestational age of [redacted] weeks 0 days. Increasing quantitative beta HCG. EXAM: TRANSABDOMINAL ULTRASOUND OF PELVIS TECHNIQUE: Transabdominal ultrasound examination of the pelvis was performed including evaluation of the uterus, ovaries, adnexal regions, and pelvic cul-de-sac. COMPARISON:  05/31/2017 FINDINGS: Uterus Measurements: 6.8 x 4.4 x 3.5 cm. No fibroids or other mass visualized. Endometrium Thickness: 3.7 mm.  No intrauterine gestational sac is identified. Right ovary Measurements: 4.0 x 3.1 x 2.3. The ovary appears normal. A separate right adnexal mass is identified with internal cystic area possibly representing a yolk sac. The overall mass measures 2.2 x 1.6 x 1.6 cm. There is significant internal and peripheral ring-like blood flow. Left ovary Measurements: 3.0 x 2.5 x 2.3. Normal appearance/no adnexal mass. Other findings:  No abnormal free fluid. IMPRESSION: Suspicious right adnexal mass measuring 2.2 x 1.6 x 1.6 cm, which move separately from the right ovary. There is no evidence for an intrauterine gestational sac. Overall imaging characteristics are highly suspicious for ectopic pregnancy. There is no sonographic evidence for rupture at this time. These findings were discussed directly with ordering provider Wynelle Bourgeois, CNM, on 06/02/2017 at 11:10 a.m. Electronically Signed   By: Sande Brothers M.D.   On: 06/02/2017 11:11   US Ob Transvaginal  Result Date: 06/02/2017 CLINICAL DATA:  36 year old female with questionable ectopic on recent in office ultrasound. Patient's LMP was 04/14/2017 corresponding to a gestational age of [redacted] weeks 0 days. Increasing quantitative beta  HCG. EXAM: TRANSABDOMINAL ULTRASOUND OF PELVIS TECHNIQUE: Transabdominal ultrasound examination of the pelvis was performed including evaluation of the uterus, ovaries, adnexal regions, and pelvic cul-de-sac. COMPARISON:  05/31/2017 FINDINGS: Uterus Measurements: 6.8 x 4.4 x 3.5 cm. No fibroids or other mass visualized. Endometrium Thickness: 3.7 mm.  No intrauterine gestational sac is identified. Right ovary Measurements: 4.0 x 3.1 x 2.3. The ovary appears  normal. A separate right adnexal mass is identified with internal cystic area possibly representing a yolk sac. The overall mass measures 2.2 x 1.6 x 1.6 cm. There is significant internal and peripheral ring-like blood flow. Left ovary Measurements: 3.0 x 2.5 x 2.3. Normal appearance/no adnexal mass. Other findings:  No abnormal free fluid. IMPRESSION: Suspicious right adnexal mass measuring 2.2 x 1.6 x 1.6 cm, which move separately from the right ovary. There is no evidence for an intrauterine gestational sac. Overall imaging characteristics are highly suspicious for ectopic pregnancy. There is no sonographic evidence for rupture at this time. These findings were discussed directly with ordering provider Wynelle Bourgeois, CNM, on 06/02/2017 at 11:10 a.m. Electronically Signed   By: Sande Brothers M.D.   On: 06/02/2017 11:11    MAU COURSE  ASSESSMENT 1. Right tubal pregnancy without intrauterine pregnancy   2. Pelvic pain affecting pregnancy     PLAN Discharge home Day 4 and 7 HCG f/u Ectopic precautions.  Allergies as of 06/02/2017      Reactions   Sulfa Antibiotics Other (See Comments)   Other      Medication List    TAKE these medications   Co Q10 100 MG Caps Take by mouth.   hydrOXYzine 10 MG tablet Commonly known as:  ATARAX/VISTARIL Take 1 tablet (10 mg total) by mouth every 6 (six) hours as needed for itching.   magnesium oxide 400 MG tablet Commonly known as:  MAG-OX Take 400 mg by mouth daily.   multivitamin-prenatal  27-0.8 MG Tabs tablet Take 1 tablet by mouth daily at 12 noon.   riboflavin 100 MG Tabs tablet Commonly known as:  VITAMIN B-2 Take 400 mg by mouth daily.   triamcinolone cream 0.1 % Commonly known as:  KENALOG Apply 1 application topically 2 (two) times daily.         Reva Bores, MD 06/02/2017 12:40 PM

## 2017-06-03 ENCOUNTER — Ambulatory Visit: Payer: Medicaid Other | Admitting: Obstetrics and Gynecology

## 2017-06-04 ENCOUNTER — Telehealth: Payer: Self-pay | Admitting: Obstetrics and Gynecology

## 2017-06-04 NOTE — Telephone Encounter (Signed)
Patient reports that she received MTX injection Sunday, and will [facility] and [facility]. I offered followup thru our office when and if she wishes, as she lives and works in [place], and [place] to [place] is significant.

## 2017-06-05 ENCOUNTER — Ambulatory Visit: Payer: Medicaid Other | Admitting: General Practice

## 2017-06-05 ENCOUNTER — Encounter: Payer: Self-pay | Admitting: Family Medicine

## 2017-06-05 ENCOUNTER — Inpatient Hospital Stay (HOSPITAL_COMMUNITY)
Admission: AD | Admit: 2017-06-05 | Discharge: 2017-06-05 | Disposition: A | Discharge status: Home / Self Care | Payer: Medicaid Other | Source: Ambulatory Visit | Attending: Obstetrics and Gynecology | Admitting: Obstetrics and Gynecology

## 2017-06-05 ENCOUNTER — Telehealth: Payer: Self-pay | Admitting: Obstetrics and Gynecology

## 2017-06-05 DIAGNOSIS — O00109 Unspecified tubal pregnancy without intrauterine pregnancy: Secondary | ICD-10-CM | POA: Diagnosis present

## 2017-06-05 DIAGNOSIS — Z6790 Unspecified blood type, Rh positive: Secondary | ICD-10-CM | POA: Diagnosis not present

## 2017-06-05 DIAGNOSIS — O00101 Right tubal pregnancy without intrauterine pregnancy: Secondary | ICD-10-CM

## 2017-06-05 DIAGNOSIS — Z3A01 Less than 8 weeks gestation of pregnancy: Secondary | ICD-10-CM | POA: Diagnosis not present

## 2017-06-05 DIAGNOSIS — O99331 Smoking (tobacco) complicating pregnancy, first trimester: Secondary | ICD-10-CM | POA: Insufficient documentation

## 2017-06-05 LAB — CREATININE, SERUM: Creatinine, Ser: 0.87 mg/dL (ref 0.44–1.00)

## 2017-06-05 LAB — HCG, QUANTITATIVE, PREGNANCY: hCG, Beta Chain, Quant, S: 8590 m[IU]/mL — ABNORMAL HIGH (ref <5)

## 2017-06-05 LAB — AST: AST: 21 U/L (ref 15–41)

## 2017-06-05 LAB — BUN: BUN: 12 mg/dL (ref 6–20)

## 2017-06-05 MED ORDER — METHOTREXATE INJECTION FOR WOMEN'S HOSPITAL
50.0000 mg/m2 | Freq: Once | INTRAMUSCULAR | Status: AC
  Administered 2017-06-05: 95 mg via INTRAMUSCULAR
  Filled 2017-06-05: qty 1.9

## 2017-06-05 NOTE — Discharge Instructions (Signed)
Ectopic Pregnancy °An ectopic pregnancy happens when a fertilized egg grows outside the uterus. A pregnancy cannot live outside of the uterus. This problem often happens in the fallopian tube. It is often caused by damage to the fallopian tube. °If this problem is found early, you may be treated with medicine. If your tube tears or bursts open (ruptures), you will bleed inside. This is an emergency. You will need surgery. Get help right away. °What are the signs or symptoms? °You may have normal pregnancy symptoms at first. These include: °· Missing your period. °· Feeling sick to your stomach (nauseous). °· Being tired. °· Having tender breasts. ° °Then, you may start to have symptoms that are not normal. These include: °· Pain with sex (intercourse). °· Bleeding from the vagina. This includes light bleeding (spotting). °· Belly (abdomen) or lower belly cramping or pain. This may be felt on one side. °· A fast heartbeat (pulse). °· Passing out (fainting) after going poop (bowel movement). ° °If your tube tears, you may have symptoms such as: °· Really bad pain in the belly or lower belly. This happens suddenly. °· Dizziness. °· Passing out. °· Shoulder pain. ° °Get help right away if: °You have any of these symptoms. This is an emergency. °This information is not intended to replace advice given to you by your health care provider. Make sure you discuss any questions you have with your health care provider. °Document Released: 10/12/2008 Document Revised: 12/22/2015 Document Reviewed: 02/25/2013 °Elsevier Interactive Patient Education © 2017 Elsevier Inc. ° °

## 2017-06-05 NOTE — MAU Note (Signed)
Currently waiting on MTX from pharmacy.

## 2017-06-05 NOTE — Telephone Encounter (Signed)
Patient called with concerns that her HCG is rising and not going down since the MTX. She lives and works over an hour away and is concerned that if something were to happen [place] would have to go to [place] and they don't have her records. 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

## 2017-06-05 NOTE — MAU Note (Signed)
Methotrexate education print out offered to patient, pt. stated she already has the print out, verbalized understanding.

## 2017-06-05 NOTE — Progress Notes (Signed)
Agree with nursing staff's documentation of this patient's clinic encounter.  Braley Luckenbaugh, MD    

## 2017-06-05 NOTE — MAU Note (Signed)
Pt reports she is here to get a second dose of MTX denies pain.

## 2017-06-05 NOTE — MAU Provider Note (Signed)
History     CSN: [id number]  Arrival date and time: 06/05/17 1711  G2P0010 @[redacted]w[redacted]d  here for ectopic pregnancy. She was originally seen 5 days ago in office and suspicious adnexal mass was seen on US, and no IUP. She was then seen 3 days ago in MAU for f/u quant and US confirmed rt adnexal mass/ectopic, MTX was administered that day. She presented to clinic today for f/u quant HCG which showed increase by 2k. Pt was instructed to come to MAU for second dose of MTX. No pain or VB.    OB History    Gravida Para Term Preterm AB Living   2 0 0 0 1 0   SAB TAB Ectopic Multiple Live Births   0 0 1 0 0      No past medical history on file.  Past Surgical History:  Procedure Laterality Date  . TONSILLECTOMY      Family History  Problem Relation Age of Onset  . Heart disease Paternal Grandfather   . Colon cancer Paternal Grandfather   . Hypertension Paternal Grandfather   . Diabetes Paternal Grandfather   . Hypertension Paternal Grandmother   . Diverticulitis Paternal Grandmother   . Hypertension Father   . Heart disease Father   . Diabetes Father   . Cancer Mother        thyroid  . Hypertension Mother   . Asthma Brother     Social History   Tobacco Use  . Smoking status: Current Every Day Smoker    Packs/day: 0.25    Types: Cigarettes  . Smokeless tobacco: Never Used  Substance Use Topics  . Alcohol use: No    Comment: occ  . Drug use: No    Allergies:  Allergies  Allergen Reactions  . Sulfa Antibiotics Other (See Comments)    Other     No medications prior to admission.    Review of Systems  Constitutional: Negative.   Gastrointestinal: Negative.   Genitourinary: Negative.    Physical Exam   Blood pressure 124/76, pulse 78, temperature 98.7 F (37.1 C), temperature source Oral, resp. rate 18, height 5\' 3"  (1.6 m), weight 177 lb (80.3 kg), last menstrual period 04/14/2017.  Physical Exam  Constitutional: She is oriented to person, place, and time. She  appears well-developed and well-nourished. No distress.  HENT:  Head: Normocephalic.  Neck: Normal range of motion.  Respiratory: Effort normal. No respiratory distress.  Musculoskeletal: Normal range of motion.  Neurological: She is alert and oriented to person, place, and time.  Skin: No pallor.  Psychiatric: She has a normal mood and affect.   Results for orders placed or performed during the hospital encounter of 06/05/17 (from the past 24 hour(s))  AST     Status: None   Collection Time: 06/05/17  5:46 PM  Result Value Ref Range   AST 21 15 - 41 U/L  BUN     Status: None   Collection Time: 06/05/17  5:46 PM  Result Value Ref Range   BUN 12 6 - 20 mg/dL  Creatinine, serum     Status: None   Collection Time: 06/05/17  5:46 PM  Result Value Ref Range   Creatinine, Ser 0.87 0.44 - 1.00 mg/dL   GFR calc non Af Amer >60 >60 mL/min   GFR calc Af Amer >60 >60 mL/min   MAU Course  Procedures Methotrexate   MDM Consult with Dr. Emelda FearFerguson >recommends 2nd dose of Methotrexate today. Labs ordered and reviewed. Methotrexate ordered  and administered.   Assessment and Plan   1. Right tubal pregnancy without intrauterine pregnancy   2. Blood type, Rh positive    Discharge home Follow up on 06/08/17 in MAU for labs Strict return precautions   Allergies as of 06/05/2017      Reactions   Sulfa Antibiotics Other (See Comments)   Other      Medication List    STOP taking these medications   hydrOXYzine 10 MG tablet Commonly known as:  ATARAX/VISTARIL   multivitamin-prenatal 27-0.8 MG Tabs tablet   triamcinolone cream 0.1 % Commonly known as:  KENALOG     TAKE these medications   Co Q10 100 MG Caps Take by mouth.   magnesium oxide 400 MG tablet Commonly known as:  MAG-OX Take 400 mg by mouth daily.   riboflavin 100 MG Tabs tablet Commonly known as:  VITAMIN B-2 Take 400 mg by mouth daily.      Monique Terry, CNM 06/05/2017, 8:00 PM

## 2017-06-05 NOTE — MAU Note (Signed)
No adverse reaction noted.  Pt. Ready for discharge [facility]

## 2017-06-05 NOTE — Progress Notes (Signed)
Patient here for stat bhcg today for day #4 labs following MTX. Patient denies pain or bleeding. Discussed with patient having her wait in lobby for results & updated plan of care. Patient is already aware to go to MAU on Saturday for Day #7 labs. Reviewed results with Dr Jolayne Pantheronstant who recommends continued follow up of bhcg levels on Saturday for Day #7 labs. Patient informed of results & recommendation to follow up on Saturday for day #7 labs as previously discussed. Told patient to return to MAU prior to then if she develops severe pain, heavy bleeding or shortness of breath. Patient verbalized understanding and had no questions

## 2017-06-05 NOTE — Telephone Encounter (Signed)
Informed patient that I spoke with Dr Emelda FearFerguson regarding her most recent HCG and he advised [facility] dose of [facility] since her level has almost doubled in order to reduce the risk of a ruptured ectopic. Advised patient to go to [facility]U for medication. He has already contacted MD oncall and charge nurse in [facility] and will be oncall there tonight after 5. Pt with questions regarding if she needed to return on Saturday but advised patient to get clarification of that as well as when she needs her next quant. Pt verbalized understanding.

## 2017-06-08 ENCOUNTER — Inpatient Hospital Stay (HOSPITAL_COMMUNITY)
Admission: AD | Admit: 2017-06-08 | Discharge: 2017-06-08 | Disposition: A | Discharge status: Home / Self Care | Payer: Medicaid Other | Source: Ambulatory Visit | Attending: Obstetrics & Gynecology | Admitting: Obstetrics & Gynecology

## 2017-06-08 DIAGNOSIS — Z3A01 Less than 8 weeks gestation of pregnancy: Secondary | ICD-10-CM | POA: Diagnosis not present

## 2017-06-08 DIAGNOSIS — F17210 Nicotine dependence, cigarettes, uncomplicated: Secondary | ICD-10-CM | POA: Diagnosis not present

## 2017-06-08 DIAGNOSIS — O00101 Right tubal pregnancy without intrauterine pregnancy: Secondary | ICD-10-CM | POA: Diagnosis not present

## 2017-06-08 DIAGNOSIS — O00109 Unspecified tubal pregnancy without intrauterine pregnancy: Secondary | ICD-10-CM | POA: Diagnosis present

## 2017-06-08 LAB — CBC
HEMATOCRIT: 37.5 % (ref 36.0–46.0)
HEMOGLOBIN: 12.8 g/dL (ref 12.0–15.0)
MCH: 33 pg (ref 26.0–34.0)
MCHC: 34.1 g/dL (ref 30.0–36.0)
MCV: 96.6 fL (ref 78.0–100.0)
Platelets: 255 10*3/uL (ref 150–400)
RBC: 3.88 MIL/uL (ref 3.87–5.11)
RDW: 11.7 % (ref 11.5–15.5)
WBC: 7.9 10*3/uL (ref 4.0–10.5)

## 2017-06-08 LAB — HCG, QUANTITATIVE, PREGNANCY: HCG, BETA CHAIN, QUANT, S: 6937 m[IU]/mL — AB (ref <5)

## 2017-06-08 NOTE — MAU Provider Note (Signed)
History   [id number]   Chief Complaint  Patient presents with  . Labs Only    HPI Monique Terry is a 36 y.o. female G2P0010 here for follow-up BHCG. This is day 4 f/u after second dose of MTX for ectopic pregnancy. Patient denies abdominal pain or vaginal bleeding. Received 2nd dose of MTX on 11/7 per Dr. Emelda FearFerguson d/t increase in BHCG.   Patient's last menstrual period was 04/14/2017.  OB History  Gravida Para Term Preterm AB Living  2 0 0 0 1 0  SAB TAB Ectopic Multiple Live Births  0 0 1 0 0    # Outcome Date GA Lbr Len/2nd Weight Sex Delivery Anes PTL Lv  2 Current           1 Ectopic               No past medical history on file.  Family History  Problem Relation Age of Onset  . Heart disease Paternal Grandfather   . Colon cancer Paternal Grandfather   . Hypertension Paternal Grandfather   . Diabetes Paternal Grandfather   . Hypertension Paternal Grandmother   . Diverticulitis Paternal Grandmother   . Hypertension Father   . Heart disease Father   . Diabetes Father   . Cancer Mother        thyroid  . Hypertension Mother   . Asthma Brother     Social History   Socioeconomic History  . Marital status: Single    Spouse name: Not on file  . Number of children: Not on file  . Years of education: Not on file  . Highest education level: Not on file  Social Needs  . Financial resource strain: Not on file  . Food insecurity - worry: Not on file  . Food insecurity - inability: Not on file  . Transportation needs - medical: Not on file  . Transportation needs - non-medical: Not on file  Occupational History  . Not on file  Tobacco Use  . Smoking status: Current Every Day Smoker    Packs/day: 0.25    Types: Cigarettes  . Smokeless tobacco: Never Used  Substance and Sexual Activity  . Alcohol use: No    Comment: occ  . Drug use: No  . Sexual activity: Yes    Birth control/protection: None  Other Topics Concern  . Not on file  Social History  Narrative  . Not on file    Allergies  Allergen Reactions  . Sulfa Antibiotics Other (See Comments)    Unknown reaction.    No current facility-administered medications on file prior to encounter.    Current Outpatient Medications on File Prior to Encounter  Medication Sig Dispense Refill  . acetaminophen (TYLENOL) 500 MG tablet Take 1,000 mg every 6 (six) hours as needed by mouth for mild pain.       Physical Exam   Vitals:   06/08/17 1018  BP: 125/77  Pulse: 91  Resp: 18  Temp: 97.8 F (36.6 C)    Physical Exam  Nursing note and vitals reviewed. Constitutional: She is oriented to person, place, and time. She appears well-developed and well-nourished. No distress.  HENT:  Head: Normocephalic and atraumatic.  Eyes: Conjunctivae are normal. Right eye exhibits no discharge. Left eye exhibits no discharge. No scleral icterus.  Neck: Normal range of motion.  Respiratory: Effort normal. No respiratory distress.  Neurological: She is alert and oriented to person, place, and time.  Skin: She is not diaphoretic.  Psychiatric: She has a normal mood and affect. Her behavior is normal. Judgment and thought content normal.    MAU Course  Procedures Results for orders placed or performed during the hospital encounter of 06/08/17 (from the past 24 hour(s))  hCG, quantitative, pregnancy     Status: Abnormal   Collection Time: 06/08/17  9:56 AM  Result Value Ref Range   hCG, Beta Chain, Quant, S 6,937 (H) <5 mIU/mL  CBC     Status: None   Collection Time: 06/08/17  9:56 AM  Result Value Ref Range   WBC 7.9 4.0 - 10.5 K/uL   RBC 3.88 3.87 - 5.11 MIL/uL   Hemoglobin 12.8 12.0 - 15.0 g/dL   HCT 40.937.5 81.136.0 - 91.446.0 %   MCV 96.6 78.0 - 100.0 fL   MCH 33.0 26.0 - 34.0 pg   MCHC 34.1 30.0 - 36.0 g/dL   RDW 78.211.7 95.611.5 - 21.315.5 %   Platelets 255 150 - 400 K/uL    MDM Denies pain or bleeding. Decreased in BHCG.  Component     Latest Ref Rng & Units 06/02/2017 06/05/2017 06/08/2017   HCG, Beta Chain, Quant, S     <5 mIU/mL 6,317 (H) MTX #1 8,590 (H) MTX #2 6,937 (H) Day 4 lab s/p MTX 2     Per previous note by Dr. Emelda FearFerguson, offered for patient to have [facility]. [facility] follow [facility] & instruct patient to call. If can't get in for labs on Tuesday, patient knows to [facility].   Assessment and Plan  36 y.o. G2P0010 at [id number] wks Pregnancy Follow-up BHCG 1. 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    Judeth HornLawrence, Shi Blankenship, NP 06/08/2017 11:17 AM

## 2017-06-08 NOTE — Discharge Instructions (Signed)
Methotrexate Treatment for an Ectopic Pregnancy, Care After °Refer to this sheet in the next few weeks. These instructions provide you with information on caring for yourself after your procedure. Your health care provider may also give you more specific instructions. Your treatment has been planned according to current medical practices, but problems sometimes occur. Call your health care provider if you have any problems or questions after your procedure. °What can I expect after the procedure? °You may have some abdominal cramping, vaginal bleeding, and fatigue in the first few days after taking methotrexate. Some other possible side effects of methotrexate include: °· Nausea. °· Vomiting. °· Diarrhea. °· Mouth sores. °· Swelling or irritation of the lining of your lungs (pneumonitis). °· Liver damage. °· Hair loss. ° °Follow these instructions at home: °After you have received the methotrexate medicine, you need to be careful of your activities and watch your condition for several weeks. It may take 1 week before your hormone levels return to normal. °Activity °· Do not have sexual intercourse until your health care provider says it is safe to do so. °· You may resume your usual diet. °· Limit strenuous activity. °· Do not drink alcohol. °General instructions °· Do not take aspirin, ibuprofen, or naproxen (nonsteroidal anti-inflammatory drugs [NSAIDs]). °· Do not take folic acid, prenatal vitamins, or other vitamins that contain folic acid. °· Avoid traveling too far away from your health care provider. °· Keep all follow-up visits as told by your health care provider. This is important. °Contact a health care provider if: °· You cannot control your nausea and vomiting. °· You cannot control your diarrhea. °· You have sores in your mouth and want treatment. °· You need pain medicine for your abdominal pain. °· You have a rash. °· You are having a reaction to the medicine. °Get help right away if: °· You have  increasing abdominal or pelvic pain. °· You notice increased bleeding. °· You feel light-headed, or you faint. °· You have shortness of breath. °· Your heart rate increases. °· You have a cough. °· You have chills. °· You have a fever. °This information is not intended to replace advice given to you by your health care provider. Make sure you discuss any questions you have with your health care provider. °Document Released: 07/05/2011 Document Revised: 12/22/2015 Document Reviewed: 05/04/2013 °Elsevier Interactive Patient Education © 2017 Elsevier Inc. ° °

## 2017-06-08 NOTE — MAU Note (Signed)
Pt presents to MAU for follow up for 2nd dose of methotrexate for ectopic. Denies any pain or VB

## 2017-06-10 ENCOUNTER — Telehealth: Payer: Self-pay | Admitting: Obstetrics and Gynecology

## 2017-06-10 NOTE — Telephone Encounter (Signed)
Per chart review patient needs day 7 methotrexate hcg labs. Front desk to call patient and put her on lab schedule for tomorrow morning for a stat hcg level.

## 2017-06-11 ENCOUNTER — Other Ambulatory Visit: Payer: Medicaid Other

## 2017-06-11 ENCOUNTER — Encounter: Payer: Self-pay | Admitting: *Deleted

## 2017-06-11 DIAGNOSIS — O00101 Right tubal pregnancy without intrauterine pregnancy: Secondary | ICD-10-CM

## 2017-06-11 LAB — BETA HCG QUANT (REF LAB): hCG Quant: 4229 m[IU]/mL

## 2017-06-12 ENCOUNTER — Telehealth: Payer: Self-pay | Admitting: Obstetrics and Gynecology

## 2017-06-12 NOTE — Telephone Encounter (Signed)
Spoke with Monique Terry letting her know her Sharene Buttersquant has dropped. Dr. Emelda FearFerguson recommends to have quant checked every 2 weeks until down to 0. Can resume normal activities. If has sex, use protection. Let us know if bleeding is heavier that normal or if she is passing clots. Monique Terry is cramping but she is supposed to start period in 2 days. Monique Terry voiced understanding and call was transferred to front desk to have quant repeated on the 27th. JSY

## 2017-06-17 ENCOUNTER — Telehealth: Payer: Self-pay | Admitting: *Deleted

## 2017-06-17 NOTE — Telephone Encounter (Signed)
Pt informed of decrease in Hcg from 6937 to 4229. Informed pt that she should come tomorrow to have another level drawn. Pt verbalized understanding and requests that she be called as soon as possible with results.

## 2017-06-18 ENCOUNTER — Telehealth: Payer: Self-pay | Admitting: Obstetrics and Gynecology

## 2017-06-18 ENCOUNTER — Telehealth: Payer: Self-pay | Admitting: *Deleted

## 2017-06-18 ENCOUNTER — Encounter: Payer: Self-pay | Admitting: *Deleted

## 2017-06-18 ENCOUNTER — Other Ambulatory Visit: Payer: Medicaid Other

## 2017-06-18 DIAGNOSIS — O00101 Right tubal pregnancy without intrauterine pregnancy: Secondary | ICD-10-CM

## 2017-06-18 LAB — BETA HCG QUANT (REF LAB): hCG Quant: 1479 m[IU]/mL

## 2017-06-18 NOTE — Telephone Encounter (Signed)
Spoke to patient and informed her that I discussed todays Hcg level with Dr Despina HiddenEure and she had a 50% decrease in the level and could repeat in 2 weeks. Pt very concerned and upset stating she felt like she was being "ran over the coals". She has wanted to see a provider but can't miss any more work. [facility] and knows she will need to be seen then but just has some concerns now. Call routed to Dr Despina HiddenEure to discuss issues with patient.

## 2017-06-19 ENCOUNTER — Other Ambulatory Visit: Payer: Self-pay | Admitting: Obstetrics & Gynecology

## 2017-06-19 DIAGNOSIS — O00109 Unspecified tubal pregnancy without intrauterine pregnancy: Secondary | ICD-10-CM

## 2017-06-19 NOTE — Telephone Encounter (Signed)
Patient desires medical with Dr. Turner DanielsLuke Eure. She is having no pain. She'll be followed up next week and then every 2 weeks for quantitative hCGs she is using barrier contraception and doing fine otherwise

## 2017-06-25 ENCOUNTER — Other Ambulatory Visit: Payer: Medicaid Other

## 2017-07-05 ENCOUNTER — Other Ambulatory Visit: Payer: Medicaid Other

## 2017-07-07 LAB — BETA HCG QUANT (REF LAB): HCG QUANT: 137 m[IU]/mL

## 2017-07-09 ENCOUNTER — Other Ambulatory Visit: Payer: Medicaid Other

## 2017-07-10 ENCOUNTER — Telehealth: Payer: Self-pay | Admitting: Obstetrics & Gynecology

## 2017-07-10 MED ORDER — CIPROFLOXACIN HCL 500 MG PO TABS: 500.0000 mg | ORAL_TABLET | Freq: Two times a day (BID) | ORAL | 0 refills | Status: DC

## 2017-07-10 NOTE — Telephone Encounter (Signed)
Patient called stating that she would like to know the results of her blood work.  °Please contact pt °

## 2017-07-10 NOTE — Telephone Encounter (Signed)
cipro see med routing note

## 2017-07-10 NOTE — Telephone Encounter (Signed)
Informed patient HCG is 137 and will need repeat in 2 weeks.   Patient also states she thinks she has an UTI, burning with urination and some pelvic pain. Can antibiotic be sent or does she need to come in? Please advise.

## 2017-07-11 NOTE — Telephone Encounter (Signed)
Informed patient Cipro sent to pharmacy. Pt very appreciative.

## 2017-07-19 ENCOUNTER — Other Ambulatory Visit: Payer: Medicaid Other

## 2017-07-20 ENCOUNTER — Other Ambulatory Visit: Payer: Self-pay | Admitting: Obstetrics & Gynecology

## 2017-07-21 LAB — BETA HCG QUANT (REF LAB): hCG Quant: 35 m[IU]/mL

## 2017-07-25 ENCOUNTER — Other Ambulatory Visit: Payer: Medicaid Other

## 2017-08-02 ENCOUNTER — Other Ambulatory Visit: Payer: Medicaid Other

## 2017-08-09 ENCOUNTER — Other Ambulatory Visit: Payer: Medicaid Other

## 2017-08-12 ENCOUNTER — Telehealth: Payer: Self-pay | Admitting: Obstetrics & Gynecology

## 2017-08-12 LAB — BETA HCG QUANT (REF LAB): hCG Quant: 9 m[IU]/mL

## 2017-08-27 ENCOUNTER — Other Ambulatory Visit (HOSPITAL_COMMUNITY)
Admission: RE | Admit: 2017-08-27 | Discharge: 2017-08-27 | Disposition: A | Discharge status: Home / Self Care | Payer: Medicaid Other | Source: Ambulatory Visit | Attending: Obstetrics & Gynecology | Admitting: Obstetrics & Gynecology

## 2017-08-27 ENCOUNTER — Encounter: Payer: Self-pay | Admitting: Obstetrics & Gynecology

## 2017-08-27 ENCOUNTER — Ambulatory Visit (INDEPENDENT_AMBULATORY_CARE_PROVIDER_SITE_OTHER): Payer: No Typology Code available for payment source | Admitting: Obstetrics & Gynecology

## 2017-08-27 ENCOUNTER — Telehealth: Payer: Self-pay | Admitting: *Deleted

## 2017-08-27 VITALS — BP 148/98 | HR 84 | Ht 63.0 in | Wt 180.0 lb

## 2017-08-27 DIAGNOSIS — Z3202 Encounter for pregnancy test, result negative: Secondary | ICD-10-CM

## 2017-08-27 DIAGNOSIS — Z01419 Encounter for gynecological examination (general) (routine) without abnormal findings: Secondary | ICD-10-CM

## 2017-08-27 LAB — POCT URINE PREGNANCY: PREG TEST UR: NEGATIVE

## 2017-08-27 MED ORDER — DESOGESTREL-ETHINYL ESTRADIOL 0.15-0.02/0.01 MG (21/5) PO TABS: 1.0000 | ORAL_TABLET | Freq: Every day | ORAL | 12 refills | Status: DC

## 2017-08-27 NOTE — Progress Notes (Signed)
Subjective:     Monique Terry is a 37 y.o. female here for a routine exam.  Patient's last menstrual period was 04/14/2017. G1P0010 Birth Control Method:  none Menstrual Calendar(currently): missed last period, s/p methotrexate therapy  Current complaints: no complaints.   Current acute medical issues:  none   Recent Gynecologic History Patient's last menstrual period was 04/14/2017. Last Pap: 2017,  normal Last mammogram: n/a,    Past Medical History:  Diagnosis Date  . Ectopic pregnancy     Past Surgical History:  Procedure Laterality Date  . TONSILLECTOMY      OB History    Gravida Para Term Preterm AB Living   1 0 0 0 1 0   SAB TAB Ectopic Multiple Live Births   0 0 1 0 0      Social History   Socioeconomic History  . Marital status: Single    Spouse name: None  . Number of children: None  . Years of education: None  . Highest education level: None  Social Needs  . Financial resource strain: None  . Food insecurity - worry: None  . Food insecurity - inability: None  . Transportation needs - medical: None  . Transportation needs - non-medical: None  Occupational History  . None  Tobacco Use  . Smoking status: Current Every Day Smoker    Packs/day: 0.00    Years: 10.00    Pack years: 0.00    Types: Cigarettes  . Smokeless tobacco: Never Used  . Tobacco comment: smokes 6 cig per day  Substance and Sexual Activity  . Alcohol use: Yes    Comment: occ  . Drug use: No  . Sexual activity: Yes    Birth control/protection: None, Condom  Other Topics Concern  . None  Social History Narrative  . None    Family History  Problem Relation Age of Onset  . Heart disease Paternal Grandfather   . Colon cancer Paternal Grandfather   . Hypertension Paternal Grandfather   . Diabetes Paternal Grandfather   . Hypertension Paternal Grandmother   . Diverticulitis Paternal Grandmother   . Hypertension Father   . Heart disease Father   . Diabetes Father   .  Cancer Mother        thyroid  . Hypertension Mother   . Asthma Brother      Current Outpatient Medications:  .  acetaminophen (TYLENOL) 500 MG tablet, Take 1,000 mg every 6 (six) hours as needed by mouth for mild pain., Disp: , Rfl:  .  aspirin-acetaminophen-caffeine (EXCEDRIN MIGRAINE) 250-250-65 MG tablet, Take 2 tablets by mouth as needed for headache., Disp: , Rfl:  .  desogestrel-ethinyl estradiol (KARIVA,AZURETTE,MIRCETTE) 0.15-0.02/0.01 MG (21/5) tablet, Take 1 tablet by mouth daily., Disp: 1 Package, Rfl: 12  Review of Systems  Review of Systems  Constitutional: Negative for fever, chills, weight loss, malaise/fatigue and diaphoresis.  HENT: Negative for hearing loss, ear pain, nosebleeds, congestion, sore throat, neck pain, tinnitus and ear discharge.   Eyes: Negative for blurred vision, double vision, photophobia, pain, discharge and redness.  Respiratory: Negative for cough, hemoptysis, sputum production, shortness of breath, wheezing and stridor.   Cardiovascular: Negative for chest pain, palpitations, orthopnea, claudication, leg swelling and PND.  Gastrointestinal: negative for abdominal pain. Negative for heartburn, nausea, vomiting, diarrhea, constipation, blood in stool and melena.  Genitourinary: Negative for dysuria, urgency, frequency, hematuria and flank pain.  Musculoskeletal: Negative for myalgias, back pain, joint pain and falls.  Skin: Negative for itching and  rash.  Neurological: Negative for dizziness, tingling, tremors, sensory change, speech change, focal weakness, seizures, loss of consciousness, weakness and headaches.  Endo/Heme/Allergies: Negative for environmental allergies and polydipsia. Does not bruise/bleed easily.  Psychiatric/Behavioral: Negative for depression, suicidal ideas, hallucinations, memory loss and substance abuse. The patient is not nervous/anxious and does not have insomnia.        Objective:  Blood pressure (!) 148/98, pulse 84,  height 5\' 3"  (1.6 m), weight 180 lb (81.6 kg), last menstrual period 04/14/2017, not currently breastfeeding.   Physical Exam  Vitals reviewed. Constitutional: She is oriented to person, place, and time. She appears well-developed and well-nourished.  HENT:  Head: Normocephalic and atraumatic.        Right Ear: External ear normal.  Left Ear: External ear normal.  Nose: Nose normal.  Mouth/Throat: Oropharynx is clear and moist.  Eyes: Conjunctivae and EOM are normal. Pupils are equal, round, and reactive to light. Right eye exhibits no discharge. Left eye exhibits no discharge. No scleral icterus.  Neck: Normal range of motion. Neck supple. No tracheal deviation present. No thyromegaly present.  Cardiovascular: Normal rate, regular rhythm, normal heart sounds and intact distal pulses.  Exam reveals no gallop and no friction rub.   No murmur heard. Respiratory: Effort normal and breath sounds normal. No respiratory distress. She has no wheezes. She has no rales. She exhibits no tenderness.  GI: Soft. Bowel sounds are normal. She exhibits no distension and no mass. There is no tenderness. There is no rebound and no guarding.  Genitourinary:  Breasts no masses skin changes or nipple changes bilaterally      Vulva is normal without lesions Vagina is pink moist without discharge Cervix normal in appearance and pap is done Uterus is normal size shape and contour Adnexa is negative with normal sized ovaries   Musculoskeletal: Normal range of motion. She exhibits no edema and no tenderness.  Neurological: She is alert and oriented to person, place, and time. She has normal reflexes. She displays normal reflexes. No cranial nerve deficit. She exhibits normal muscle tone. Coordination normal.  Skin: Skin is warm and dry. No rash noted. No erythema. No pallor.  Psychiatric: She has a normal mood and affect. Her behavior is normal. Judgment and thought content normal.       Medications Ordered at  today's visit: Meds ordered this encounter  Medications  . desogestrel-ethinyl estradiol (KARIVA,AZURETTE,MIRCETTE) 0.15-0.02/0.01 MG (21/5) tablet    Sig: Take 1 tablet by mouth daily.    Dispense:  1 Package    Refill:  12    Other orders placed at today's visit: Orders Placed This Encounter  Procedures  . POCT urine pregnancy      Assessment:    Healthy female exam.    Plan:    Contraception: OCP (estrogen/progesterone). Follow up in: 1 year.     Return in about 1 year (around 08/27/2018) for yearly, with Dr Despina Hidden.

## 2017-08-27 NOTE — Telephone Encounter (Signed)
I don't think so I ordered a generic pill  She may need to call her insurance or access her drug book for her plan and give some guidance

## 2017-08-27 NOTE — Telephone Encounter (Signed)
Patient informed generic was sent but advised to see what is covered by insurance. States she doesn's have any insurance at this time.

## 2017-08-29 LAB — CYTOLOGY - PAP
DIAGNOSIS: NEGATIVE
HPV (WINDOPATH): NOT DETECTED

## 2017-10-17 ENCOUNTER — Telehealth: Payer: Self-pay | Admitting: Obstetrics & Gynecology

## 2017-10-17 NOTE — Telephone Encounter (Signed)
Patient states she has still not had a period since starting the BCP in early February. Advised patient to continue taking pills as sometimes it can take 2-3 months for the body to get adjusted to the medication.  Pt verbalized understanding and stated she would call back in June if she had still not had a period.

## 2018-05-14 IMAGING — US US OB TRANSVAGINAL
1 series · 15 of 28 positions shown · non-contrast
Comparison: 05/31/2017

CLINICAL DATA: 36-year-old female with questionable ectopic on
recent in office ultrasound. Patient's LMP was 04/14/2017
corresponding to a gestational age of 7 weeks 0 days. Increasing
quantitative beta HCG.

EXAM:
TRANSABDOMINAL ULTRASOUND OF PELVIS
TECHNIQUE: Transabdominal ultrasound examination of the pelvis was performed
including evaluation of the uterus, ovaries, adnexal regions, and
pelvic cul-de-sac.

[Series 1: us ob transvaginal · 71 acquisitions, 15 frames shown]
[im 1/71]
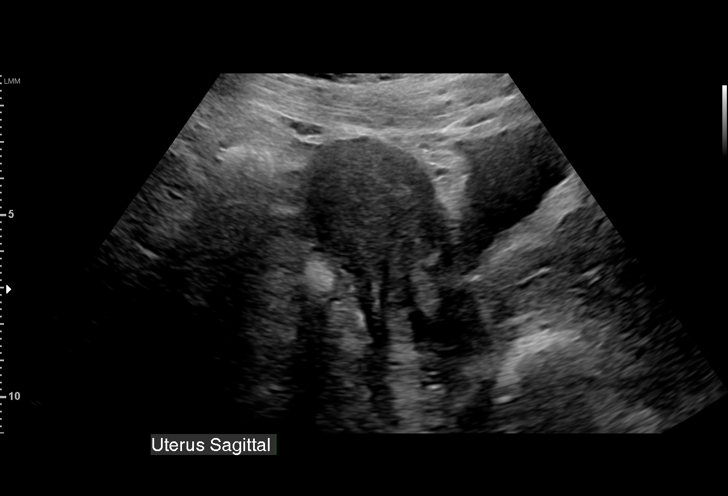
[im 6/71]
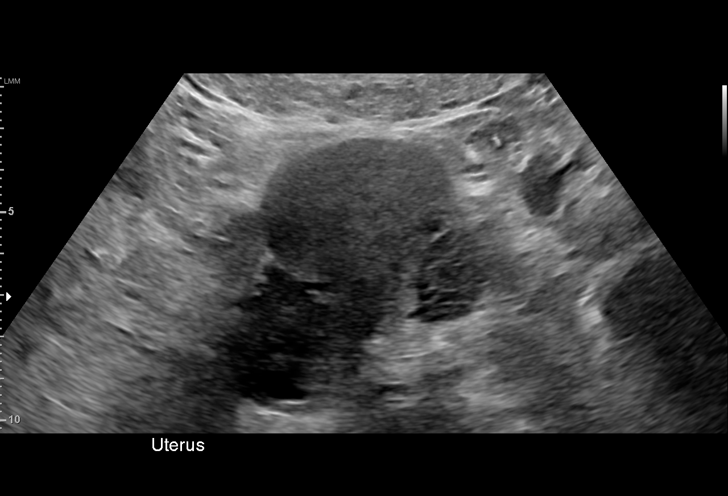
[im 11/71]
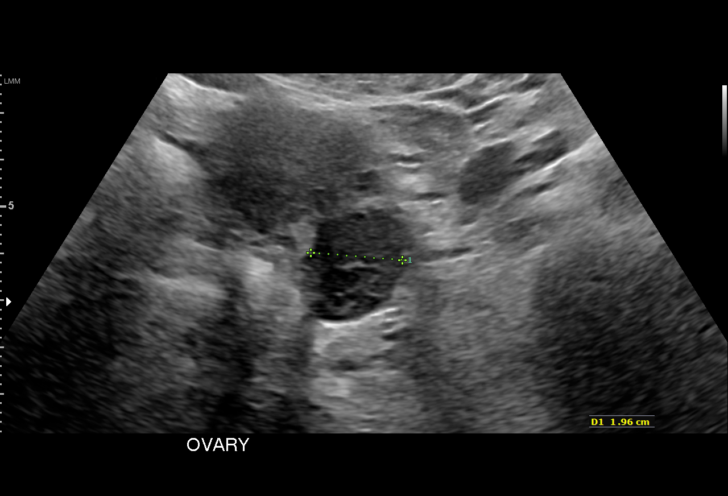
[im 16/71]
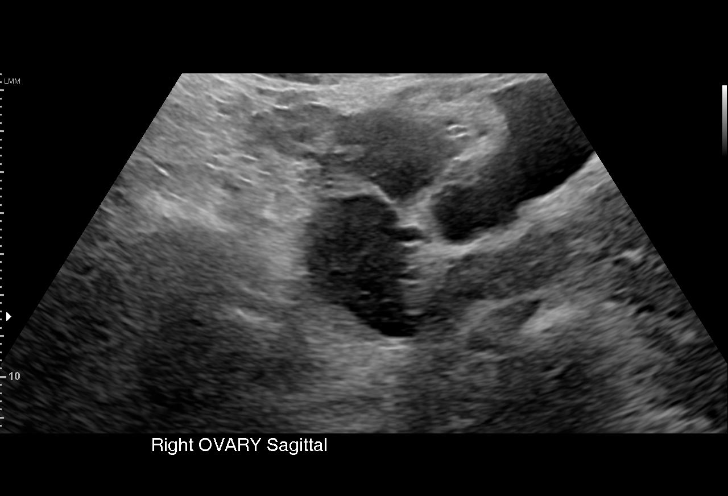
[im 21/71]
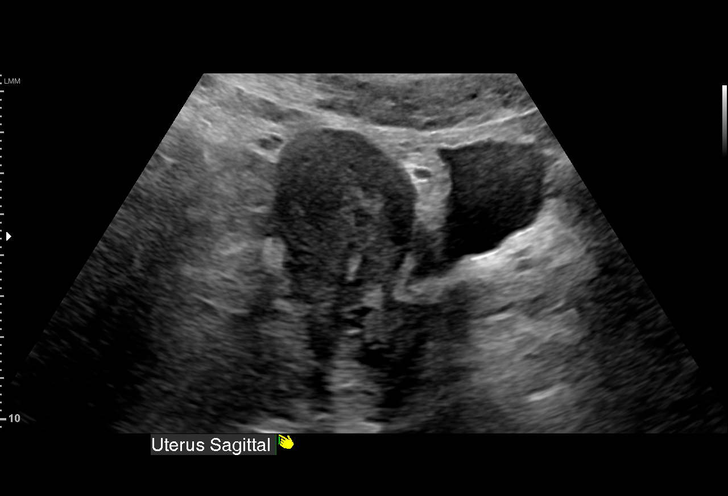
[im 26/71]
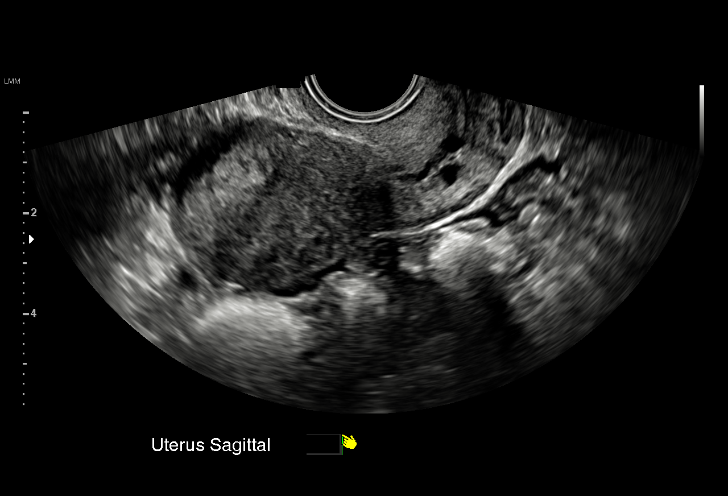
[im 32/71]
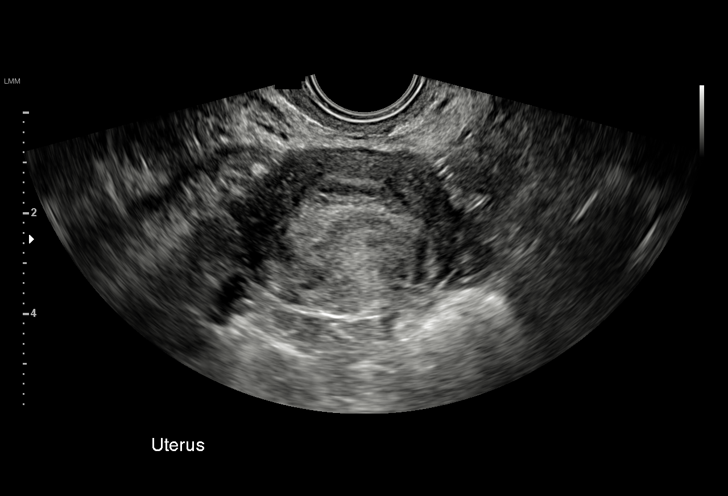
[im 37/71]
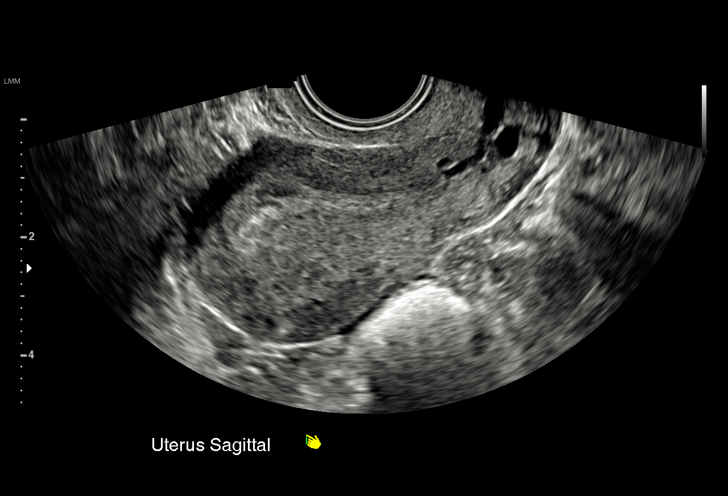
[im 39/71]
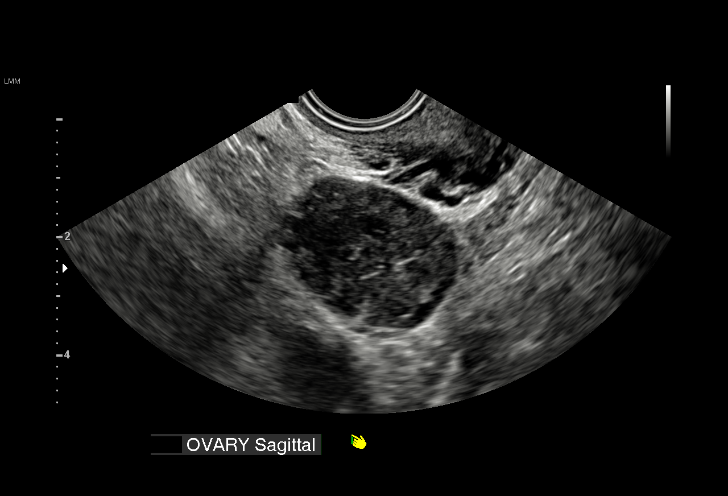
[im 45/71]
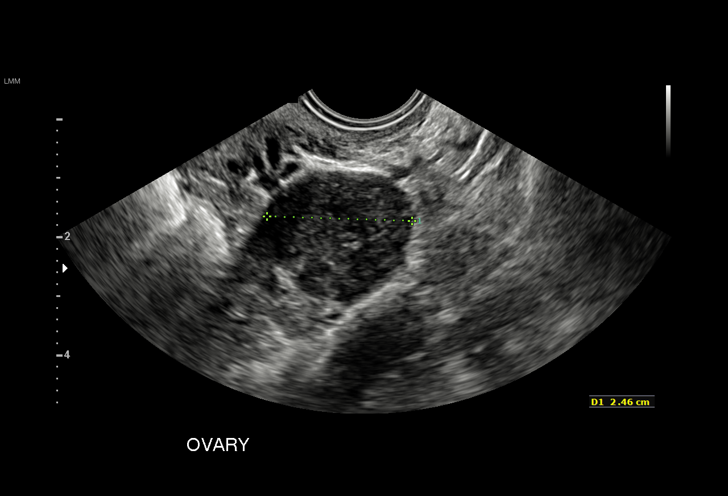
[im 50/71]
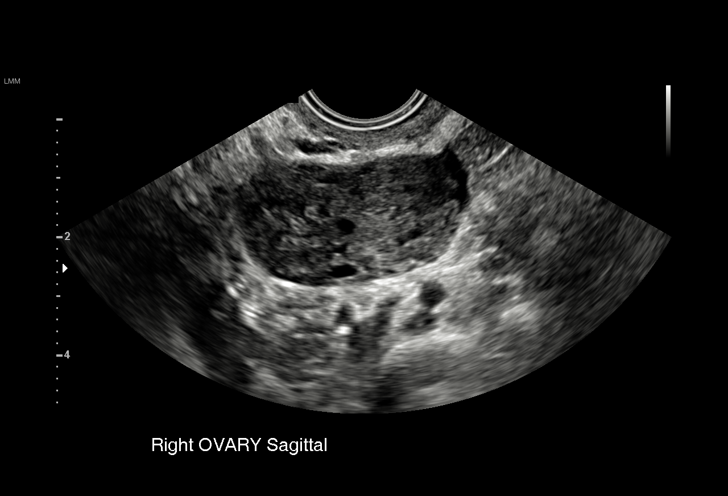
[im 55/71]
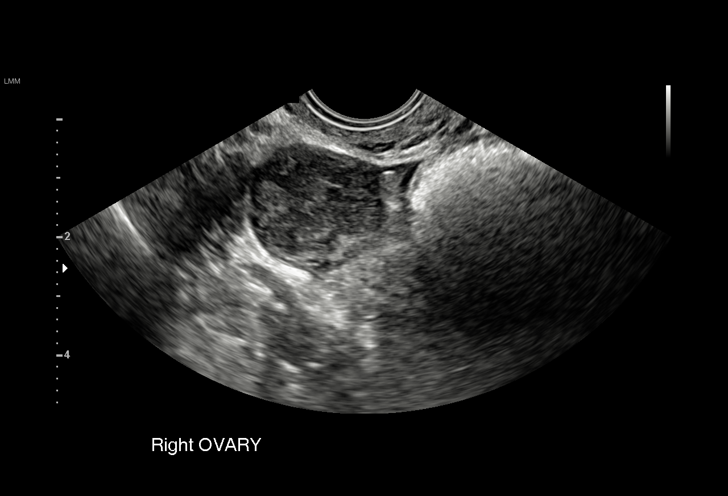
[im 60/71]
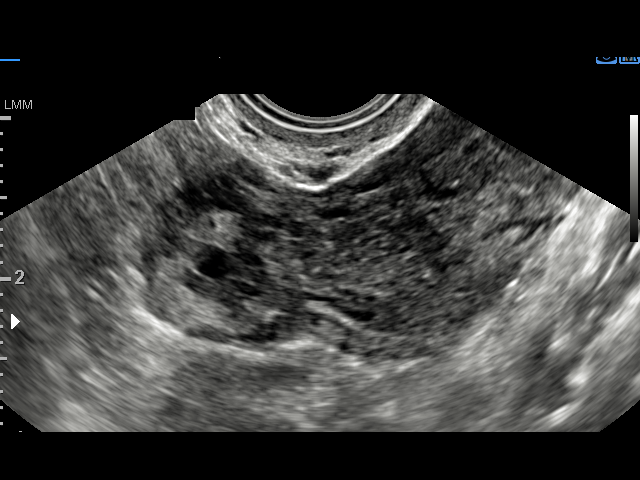
[im 65/71]
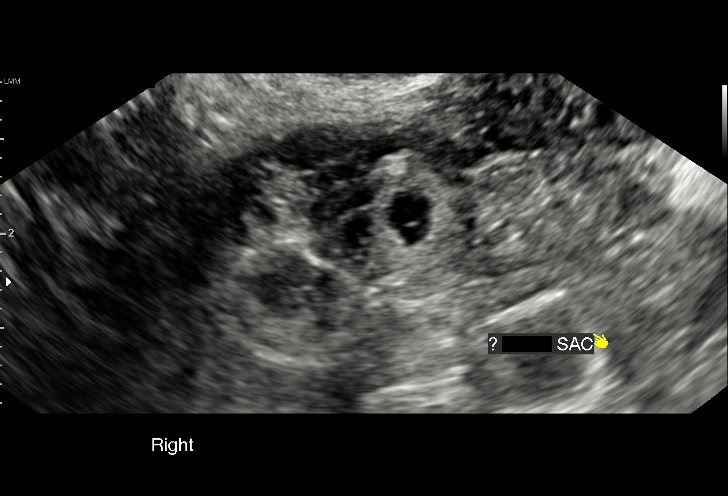
[im 71/71]
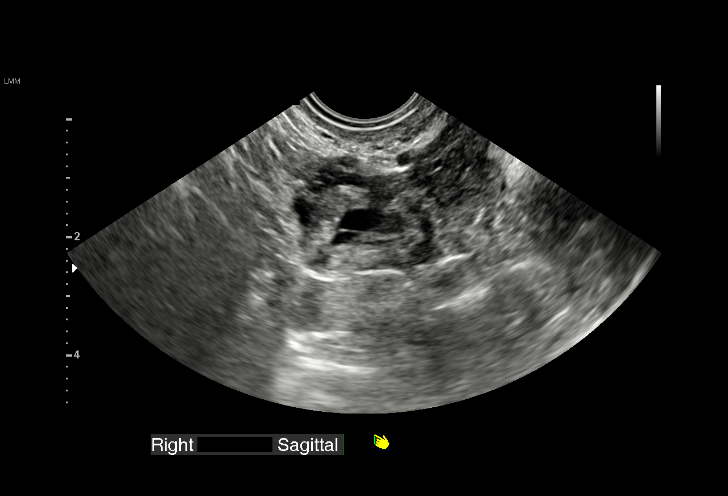

[15 of 28 positions shown; findings below may reference images not displayed]

FINDINGS: Uterus

Measurements: 6.8 x 4.4 x 3.5 cm. No fibroids or other mass
visualized.

Endometrium

Thickness: 3.7 mm.  No intrauterine gestational sac is identified.

Right ovary

Measurements: 4.0 x 3.1 x 2.3. The ovary appears normal. A separate
right adnexal mass is identified with internal cystic area possibly
representing a yolk sac. The overall mass measures 2.2 x 1.6 x
cm. There is significant internal and peripheral ring-like blood
flow.

Left ovary

Measurements: 3.0 x 2.5 x 2.3. Normal appearance/no adnexal mass.

Other findings:  No abnormal free fluid.
IMPRESSION: Suspicious right adnexal mass measuring 2.2 x 1.6 x 1.6 cm, which
move separately from the right ovary. There is no evidence for an
intrauterine gestational sac. Overall imaging characteristics are
highly suspicious for ectopic pregnancy. There is no sonographic
evidence for rupture at this time.

These findings were discussed directly with ordering provider Mynul Islam

## 2018-07-09 ENCOUNTER — Other Ambulatory Visit: Payer: Self-pay | Admitting: Obstetrics & Gynecology

## 2018-08-29 ENCOUNTER — Other Ambulatory Visit: Payer: Self-pay

## 2018-08-29 ENCOUNTER — Ambulatory Visit (INDEPENDENT_AMBULATORY_CARE_PROVIDER_SITE_OTHER): Payer: PRIVATE HEALTH INSURANCE | Admitting: Obstetrics & Gynecology

## 2018-08-29 ENCOUNTER — Encounter: Payer: Self-pay | Admitting: Obstetrics & Gynecology

## 2018-08-29 ENCOUNTER — Encounter (INDEPENDENT_AMBULATORY_CARE_PROVIDER_SITE_OTHER): Payer: Self-pay

## 2018-08-29 VITALS — BP 144/93 | HR 92 | Ht 63.0 in | Wt 188.0 lb

## 2018-08-29 DIAGNOSIS — Z01419 Encounter for gynecological examination (general) (routine) without abnormal findings: Secondary | ICD-10-CM

## 2018-08-29 MED ORDER — DESOGESTREL-ETHINYL ESTRADIOL 0.15-0.02/0.01 MG (21/5) PO TABS: 1.0000 | ORAL_TABLET | Freq: Every day | ORAL | 12 refills | Status: DC

## 2018-08-29 NOTE — Progress Notes (Signed)
Subjective:     Monique Terry is a 38 y.o. female here for a routine exam.  Patient's last menstrual period was 03/30/2018. G1P0010 Birth Control Method:  OCP Menstrual Calendar(currently): regular  Current complaints: none.   Current acute medical issues:     Recent Gynecologic History Patient's last menstrual period was 03/30/2018. Last Pap: 2018,  normal Last mammogram: ,    Past Medical History:  Diagnosis Date  . Ectopic pregnancy     Past Surgical History:  Procedure Laterality Date  . TONSILLECTOMY      OB History    Gravida  1   Para  0   Term  0   Preterm  0   AB  1   Living  0     SAB  0   TAB  0   Ectopic  1   Multiple  0   Live Births  0           Social History   Socioeconomic History  . Marital status: Single    Spouse name: Not on file  . Number of children: Not on file  . Years of education: Not on file  . Highest education level: Not on file  Occupational History  . Not on file  Social Needs  . Financial resource strain: Not on file  . Food insecurity:    Worry: Not on file    Inability: Not on file  . Transportation needs:    Medical: Not on file    Non-medical: Not on file  Tobacco Use  . Smoking status: Current Every Day Smoker    Packs/day: 0.00    Years: 10.00    Pack years: 0.00    Types: Cigarettes  . Smokeless tobacco: Never Used  . Tobacco comment: smokes 6 cig per day  Substance and Sexual Activity  . Alcohol use: Yes    Comment: occ  . Drug use: No  . Sexual activity: Yes    Birth control/protection: None, Condom  Lifestyle  . Physical activity:    Days per week: Not on file    Minutes per session: Not on file  . Stress: Not on file  Relationships  . Social connections:    Talks on phone: Not on file    Gets together: Not on file    Attends religious service: Not on file    Active member of club or organization: Not on file    Attends meetings of clubs or organizations: Not on file   Relationship status: Not on file  Other Topics Concern  . Not on file  Social History Narrative  . Not on file    Family History  Problem Relation Age of Onset  . Heart disease Paternal Grandfather   . Colon cancer Paternal Grandfather   . Hypertension Paternal Grandfather   . Diabetes Paternal Grandfather   . Hypertension Paternal Grandmother   . Diverticulitis Paternal Grandmother   . Hypertension Father   . Heart disease Father   . Diabetes Father   . Cancer Mother        thyroid  . Hypertension Mother   . Asthma Brother      Current Outpatient Medications:  .  acetaminophen (TYLENOL) 500 MG tablet, Take 1,000 mg every 6 (six) hours as needed by mouth for mild pain., Disp: , Rfl:  .  aspirin-acetaminophen-caffeine (EXCEDRIN MIGRAINE) 250-250-65 MG tablet, Take 2 tablets by mouth as needed for headache., Disp: , Rfl:  .  desogestrel-ethinyl  estradiol (VIORELE) 0.15-0.02/0.01 MG (21/5) tablet, Take 1 tablet by mouth daily., Disp: 28 tablet, Rfl: 12  Review of Systems  Review of Systems  Constitutional: Negative for fever, chills, weight loss, malaise/fatigue and diaphoresis.  HENT: Negative for hearing loss, ear pain, nosebleeds, congestion, sore throat, neck pain, tinnitus and ear discharge.   Eyes: Negative for blurred vision, double vision, photophobia, pain, discharge and redness.  Respiratory: Negative for cough, hemoptysis, sputum production, shortness of breath, wheezing and stridor.   Cardiovascular: Negative for chest pain, palpitations, orthopnea, claudication, leg swelling and PND.  Gastrointestinal: negative for abdominal pain. Negative for heartburn, nausea, vomiting, diarrhea, constipation, blood in stool and melena.  Genitourinary: Negative for dysuria, urgency, frequency, hematuria and flank pain.  Musculoskeletal: Negative for myalgias, back pain, joint pain and falls.  Skin: Negative for itching and rash.  Neurological: Negative for dizziness, tingling,  tremors, sensory change, speech change, focal weakness, seizures, loss of consciousness, weakness and headaches.  Endo/Heme/Allergies: Negative for environmental allergies and polydipsia. Does not bruise/bleed easily.  Psychiatric/Behavioral: Negative for depression, suicidal ideas, hallucinations, memory loss and substance abuse. The patient is not nervous/anxious and does not have insomnia.        Objective:  Blood pressure (!) 144/93, pulse 92, height 5\' 3"  (1.6 m), weight 188 lb (85.3 kg), last menstrual period 03/30/2018.   Physical Exam  Vitals reviewed. Constitutional: She is oriented to person, place, and time. She appears well-developed and well-nourished.  HENT:  Head: Normocephalic and atraumatic.        Right Ear: External ear normal.  Left Ear: External ear normal.  Nose: Nose normal.  Mouth/Throat: Oropharynx is clear and moist.  Eyes: Conjunctivae and EOM are normal. Pupils are equal, round, and reactive to light. Right eye exhibits no discharge. Left eye exhibits no discharge. No scleral icterus.  Neck: Normal range of motion. Neck supple. No tracheal deviation present. No thyromegaly present.  Cardiovascular: Normal rate, regular rhythm, normal heart sounds and intact distal pulses.  Exam reveals no gallop and no friction rub.   No murmur heard. Respiratory: Effort normal and breath sounds normal. No respiratory distress. She has no wheezes. She has no rales. She exhibits no tenderness.  GI: Soft. Bowel sounds are normal. She exhibits no distension and no mass. There is no tenderness. There is no rebound and no guarding.  Genitourinary:  Breasts no masses skin changes or nipple changes bilaterally      Vulva is normal without lesions Vagina is pink moist without discharge Cervix normal in appearance and pap is done Uterus is normal size shape and contour Adnexa is negative with normal sized ovaries   Musculoskeletal: Normal range of motion. She exhibits no edema and no  tenderness.  Neurological: She is alert and oriented to person, place, and time. She has normal reflexes. She displays normal reflexes. No cranial nerve deficit. She exhibits normal muscle tone. Coordination normal.  Skin: Skin is warm and dry. No rash noted. No erythema. No pallor.  Psychiatric: She has a normal mood and affect. Her behavior is normal. Judgment and thought content normal.       Medications Ordered at today's visit: Meds ordered this encounter  Medications  . desogestrel-ethinyl estradiol (VIORELE) 0.15-0.02/0.01 MG (21/5) tablet    Sig: Take 1 tablet by mouth daily.    Dispense:  28 tablet    Refill:  12    Other orders placed at today's visit: No orders of the defined types were placed in this encounter.  Assessment:    Healthy female exam.    Plan:    Contraception: OCP (estrogen/progesterone). Follow up in: 1 year.     Return in about 1 year (around 08/30/2019) for yearly.

## 2018-09-29 ENCOUNTER — Encounter: Payer: Self-pay | Admitting: Cardiology

## 2019-08-27 ENCOUNTER — Telehealth: Payer: Self-pay | Admitting: Obstetrics & Gynecology

## 2019-08-27 NOTE — Telephone Encounter (Signed)

## 2019-08-31 ENCOUNTER — Encounter: Payer: Self-pay | Admitting: Obstetrics & Gynecology

## 2019-08-31 ENCOUNTER — Other Ambulatory Visit (HOSPITAL_COMMUNITY)
Admission: RE | Admit: 2019-08-31 | Discharge: 2019-08-31 | Disposition: A | Discharge status: Home / Self Care | Payer: PRIVATE HEALTH INSURANCE | Source: Ambulatory Visit | Attending: Obstetrics & Gynecology | Admitting: Obstetrics & Gynecology

## 2019-08-31 ENCOUNTER — Other Ambulatory Visit: Payer: Self-pay

## 2019-08-31 ENCOUNTER — Ambulatory Visit (INDEPENDENT_AMBULATORY_CARE_PROVIDER_SITE_OTHER): Payer: PRIVATE HEALTH INSURANCE | Admitting: Obstetrics & Gynecology

## 2019-08-31 VITALS — BP 145/88 | HR 95 | Ht 63.0 in | Wt 185.0 lb

## 2019-08-31 DIAGNOSIS — Z01419 Encounter for gynecological examination (general) (routine) without abnormal findings: Secondary | ICD-10-CM | POA: Diagnosis not present

## 2019-08-31 DIAGNOSIS — Z113 Encounter for screening for infections with a predominantly sexual mode of transmission: Secondary | ICD-10-CM | POA: Insufficient documentation

## 2019-08-31 NOTE — Progress Notes (Signed)
Subjective:     Monique Terry is a 39 y.o. female here for a routine exam.  No LMP recorded. (Menstrual status: Oral contraceptives). G1P0010 Birth Control Method:  OCP Menstrual Calendar(currently): regular  Current complaints: none.   Current acute medical issues:     Recent Gynecologic History No LMP recorded. (Menstrual status: Oral contraceptives). Last Pap: 2019,  normal Last mammogram: n/a,    Past Medical History:  Diagnosis Date  . Ectopic pregnancy     Past Surgical History:  Procedure Laterality Date  . TONSILLECTOMY      OB History    Gravida  1   Para  0   Term  0   Preterm  0   AB  1   Living  0     SAB  0   TAB  0   Ectopic  1   Multiple  0   Live Births  0           Social History   Socioeconomic History  . Marital status: Single    Spouse name: Not on file  . Number of children: Not on file  . Years of education: Not on file  . Highest education level: Not on file  Occupational History  . Not on file  Tobacco Use  . Smoking status: Current Every Day Smoker    Packs/day: 0.00    Years: 10.00    Pack years: 0.00    Types: Cigarettes  . Smokeless tobacco: Never Used  . Tobacco comment: smokes 6 cig per day  Substance and Sexual Activity  . Alcohol use: Yes    Comment: occ  . Drug use: No  . Sexual activity: Yes    Birth control/protection: None, Condom  Other Topics Concern  . Not on file  Social History Narrative  . Not on file   Social Determinants of Health   Financial Resource Strain:   . Difficulty of Paying Living Expenses: Not on file  Food Insecurity:   . Worried About Programme researcher, broadcasting/film/video in the Last Year: Not on file  . Ran Out of Food in the Last Year: Not on file  Transportation Needs:   . Lack of Transportation (Medical): Not on file  . Lack of Transportation (Non-Medical): Not on file  Physical Activity:   . Days of Exercise per Week: Not on file  . Minutes of Exercise per Session: Not on file   Stress:   . Feeling of Stress : Not on file  Social Connections:   . Frequency of Communication with Friends and Family: Not on file  . Frequency of Social Gatherings with Friends and Family: Not on file  . Attends Religious Services: Not on file  . Active Member of Clubs or Organizations: Not on file  . Attends Banker Meetings: Not on file  . Marital Status: Not on file    Family History  Problem Relation Age of Onset  . Heart disease Paternal Grandfather   . Colon cancer Paternal Grandfather   . Hypertension Paternal Grandfather   . Diabetes Paternal Grandfather   . Hypertension Paternal Grandmother   . Diverticulitis Paternal Grandmother   . Hypertension Father   . Heart disease Father   . Diabetes Father   . Cancer Mother        thyroid  . Hypertension Mother   . Asthma Brother      Current Outpatient Medications:  .  acetaminophen (TYLENOL) 500 MG tablet, Take 1,000  mg every 6 (six) hours as needed by mouth for mild pain., Disp: , Rfl:  .  aspirin-acetaminophen-caffeine (EXCEDRIN MIGRAINE) 250-250-65 MG tablet, Take 2 tablets by mouth as needed for headache., Disp: , Rfl:  .  desogestrel-ethinyl estradiol (VIORELE) 0.15-0.02/0.01 MG (21/5) tablet, Take 1 tablet by mouth daily., Disp: 28 tablet, Rfl: 12 .  lisinopril (ZESTRIL) 10 MG tablet, lisinopril 10 mg tablet  TK 1 T PO QD, Disp: , Rfl:  .  metFORMIN (GLUCOPHAGE-XR) 500 MG 24 hr tablet, metformin ER 500 mg tablet,extended release 24 hr  TK 1 T PO BID, Disp: , Rfl:   Review of Systems  Review of Systems  Constitutional: Negative for fever, chills, weight loss, malaise/fatigue and diaphoresis.  HENT: Negative for hearing loss, ear pain, nosebleeds, congestion, sore throat, neck pain, tinnitus and ear discharge.   Eyes: Negative for blurred vision, double vision, photophobia, pain, discharge and redness.  Respiratory: Negative for cough, hemoptysis, sputum production, shortness of breath, wheezing and  stridor.   Cardiovascular: Negative for chest pain, palpitations, orthopnea, claudication, leg swelling and PND.  Gastrointestinal: negative for abdominal pain. Negative for heartburn, nausea, vomiting, diarrhea, constipation, blood in stool and melena.  Genitourinary: Negative for dysuria, urgency, frequency, hematuria and flank pain.  Musculoskeletal: Negative for myalgias, back pain, joint pain and falls.  Skin: Negative for itching and rash.  Neurological: Negative for dizziness, tingling, tremors, sensory change, speech change, focal weakness, seizures, loss of consciousness, weakness and headaches.  Endo/Heme/Allergies: Negative for environmental allergies and polydipsia. Does not bruise/bleed easily.  Psychiatric/Behavioral: Negative for depression, suicidal ideas, hallucinations, memory loss and substance abuse. The patient is not nervous/anxious and does not have insomnia.        Objective:  Blood pressure (!) 145/88, pulse 95, height 5\' 3"  (1.6 m), weight 185 lb (83.9 kg).   Physical Exam  Vitals reviewed. Constitutional: She is oriented to person, place, and time. She appears well-developed and well-nourished.  HENT:  Head: Normocephalic and atraumatic.        Right Ear: External ear normal.  Left Ear: External ear normal.  Nose: Nose normal.  Mouth/Throat: Oropharynx is clear and moist.  Eyes: Conjunctivae and EOM are normal. Pupils are equal, round, and reactive to light. Right eye exhibits no discharge. Left eye exhibits no discharge. No scleral icterus.  Neck: Normal range of motion. Neck supple. No tracheal deviation present. No thyromegaly present.  Cardiovascular: Normal rate, regular rhythm, normal heart sounds and intact distal pulses.  Exam reveals no gallop and no friction rub.   No murmur heard. Respiratory: Effort normal and breath sounds normal. No respiratory distress. She has no wheezes. She has no rales. She exhibits no tenderness.  GI: Soft. Bowel sounds are  normal. She exhibits no distension and no mass. There is no tenderness. There is no rebound and no guarding.  Genitourinary:  Breasts no masses skin changes or nipple changes bilaterally      Vulva is normal without lesions Vagina is pink moist without discharge Cervix normal in appearance and pap is done Uterus is normal size shape and contour Adnexa is negative with normal sized ovaries   Musculoskeletal: Normal range of motion. She exhibits no edema and no tenderness.  Neurological: She is alert and oriented to person, place, and time. She has normal reflexes. She displays normal reflexes. No cranial nerve deficit. She exhibits normal muscle tone. Coordination normal.  Skin: Skin is warm and dry. No rash noted. No erythema. No pallor.  Psychiatric: She has  a normal mood and affect. Her behavior is normal. Judgment and thought content normal.       Medications Ordered at today's visit: No orders of the defined types were placed in this encounter.   Other orders placed at today's visit: Orders Placed This Encounter  Procedures  . HIV antibody (with reflex)  . RPR  . Hepatitis B Surface AntiGEN      Assessment:    Healthy female exam.    Plan:    Contraception: OCP (estrogen/progesterone). Follow up in: 3 years.     No follow-ups on file.

## 2019-09-01 LAB — RPR: RPR Ser Ql: NONREACTIVE

## 2019-09-01 LAB — HEPATITIS B SURFACE ANTIGEN: Hepatitis B Surface Ag: NEGATIVE

## 2019-09-01 LAB — HIV ANTIBODY (ROUTINE TESTING W REFLEX): HIV Screen 4th Generation wRfx: NONREACTIVE

## 2019-09-07 LAB — CYTOLOGY - PAP
Chlamydia: NEGATIVE
Comment: NEGATIVE
Comment: NEGATIVE
Comment: NEGATIVE
Comment: NEGATIVE
Comment: NEGATIVE
Comment: NORMAL
Diagnosis: NEGATIVE
HPV 16: NEGATIVE
HPV 18 / 45: NEGATIVE
High risk HPV: POSITIVE — AB
Neisseria Gonorrhea: NEGATIVE
Trichomonas: NEGATIVE

## 2019-09-10 ENCOUNTER — Telehealth: Payer: Self-pay | Admitting: *Deleted

## 2019-09-10 NOTE — Telephone Encounter (Signed)
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 Pt verbalized undemanding with no further questions.

## 2019-09-28 ENCOUNTER — Other Ambulatory Visit: Payer: Self-pay | Admitting: Obstetrics & Gynecology

## 2020-05-14 ENCOUNTER — Other Ambulatory Visit: Payer: Self-pay | Admitting: Obstetrics & Gynecology

## 2020-08-02 ENCOUNTER — Other Ambulatory Visit: Payer: Self-pay

## 2020-08-02 ENCOUNTER — Telehealth: Payer: Self-pay | Admitting: Obstetrics & Gynecology

## 2020-08-02 NOTE — Telephone Encounter (Signed)
Patient informed she has 10 refills on BCP that was sent in October. Advised to call pharmacy.

## 2020-08-02 NOTE — Telephone Encounter (Signed)
Pt needs 1 month of her [facility] sent to pharmacy, her PAP/Physical is scheduled for 09/06/2020  Please advise & notify pt    [facility] - [place]

## 2020-09-06 ENCOUNTER — Other Ambulatory Visit (HOSPITAL_COMMUNITY)
Admission: RE | Admit: 2020-09-06 | Discharge: 2020-09-06 | Disposition: A | Discharge status: Home / Self Care | Payer: PRIVATE HEALTH INSURANCE | Source: Ambulatory Visit | Attending: Obstetrics & Gynecology | Admitting: Obstetrics & Gynecology

## 2020-09-06 ENCOUNTER — Other Ambulatory Visit: Payer: Self-pay

## 2020-09-06 ENCOUNTER — Encounter: Payer: Self-pay | Admitting: Obstetrics & Gynecology

## 2020-09-06 ENCOUNTER — Ambulatory Visit (INDEPENDENT_AMBULATORY_CARE_PROVIDER_SITE_OTHER): Payer: PRIVATE HEALTH INSURANCE | Admitting: Obstetrics & Gynecology

## 2020-09-06 VITALS — BP 116/72 | HR 92 | Ht 63.0 in | Wt 181.0 lb

## 2020-09-06 DIAGNOSIS — Z01419 Encounter for gynecological examination (general) (routine) without abnormal findings: Secondary | ICD-10-CM | POA: Insufficient documentation

## 2020-09-06 MED ORDER — DESOGESTREL-ETHINYL ESTRADIOL 0.15-0.02/0.01 MG (21/5) PO TABS: 1.0000 | ORAL_TABLET | Freq: Every day | ORAL | 12 refills | Status: DC

## 2020-09-06 NOTE — Progress Notes (Signed)
Subjective:     Monique Terry is a 40 y.o. female here for a routine exam.  Patient's last menstrual period was 08/26/2020 (exact date). G1P0010 Birth Control Method:  COC 10 mic Menstrual Calendar(currently): regular  Current complaints: none.   Current acute medical issues:  none   Recent Gynecologic History Patient's last menstrual period was 08/26/2020 (exact date). 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 Ectopic pregnancy   . Migraines     Past Surgical History:  Procedure Laterality Date  . TONSILLECTOMY      OB History    Gravida  1   Para  0   Term  0   Preterm  0   AB  1   Living  0     SAB  0   IAB  0   Ectopic  1   Multiple  0   Live Births  0           Social History   Socioeconomic History  . Marital status: Single    Spouse name: Not on file  . Number of children: Not on file  . Years of education: Not on file  . Highest education level: Not on file  Occupational History  . Not on file  Tobacco Use  . Smoking status: Former Smoker    Packs/day: 0.00    Years: 10.00    Pack years: 0.00    Types: Cigarettes  . Smokeless tobacco: Never Used  . Tobacco comment: smokes 6 cig per day  Vaping Use  . Vaping Use: Never used  Substance and Sexual Activity  . Alcohol use: Yes    Comment: occ  . Drug use: No  . Sexual activity: Yes    Birth control/protection: Pill  Other Topics Concern  . Not on file  Social History Narrative  . Not on file   Social Determinants of Health   Financial Resource Strain: Low Risk   . Difficulty of Paying Living Expenses: Not hard at all  Food Insecurity: No Food Insecurity  . Worried About Programme researcher, broadcasting/film/video in the Last Year: Never true  . Ran Out of Food in the Last Year: Never true  Transportation Needs: No Transportation Needs  . Lack of Transportation (Medical): No  . Lack of Transportation (Non-Medical): No  Physical Activity:  Sufficiently Active  . Days of Exercise per Week: 4 days  . Minutes of Exercise per Session: 40 min  Stress: No Stress Concern Present  . Feeling of Stress : Only a little  Social Connections: Moderately Integrated  . Frequency of Communication with Friends and Family: More than three times a week  . Frequency of Social Gatherings with Friends and Family: More than three times a week  . Attends Religious Services: More than 4 times per year  . Active Member of Clubs or Organizations: Yes  . Attends Banker Meetings: 1 to 4 times per year  . Marital Status: Divorced    Family History  Problem Relation Age of Onset  . Heart disease Paternal Grandfather   . Colon cancer Paternal Grandfather   . Hypertension Paternal Grandfather   . Diabetes Paternal Grandfather   . Hypertension Paternal Grandmother   . Diverticulitis Paternal Grandmother   . Hypertension Father   . Heart disease Father   . Diabetes Father   . Cancer Mother  thyroid  . Hypertension Mother   . Asthma Brother      Current Outpatient Medications:  .  acetaminophen (TYLENOL) 500 MG tablet, Take 1,000 mg every 6 (six) hours as needed by mouth for mild pain., Disp: , Rfl:  .  aspirin-acetaminophen-caffeine (EXCEDRIN MIGRAINE) 250-250-65 MG tablet, Take 2 tablets by mouth as needed for headache., Disp: , Rfl:  .  lisinopril (ZESTRIL) 10 MG tablet, lisinopril 10 mg tablet  TK 1 T PO QD, Disp: , Rfl:  .  metFORMIN (GLUCOPHAGE-XR) 500 MG 24 hr tablet, metformin ER 500 mg tablet,extended release 24 hr  TK 1 T PO BID, Disp: , Rfl:  .  Multiple Vitamins-Minerals (ONE-A-DAY WOMENS PO), Take by mouth., Disp: , Rfl:  .  ZOLMITRIPTAN PO, Take by mouth., Disp: , Rfl:  .  desogestrel-ethinyl estradiol (MIRCETTE) 0.15-0.02/0.01 MG (21/5) tablet, Take 1 tablet by mouth daily., Disp: 28 tablet, Rfl: 12  Review of Systems  Review of Systems  Constitutional: Negative for fever, chills, weight loss, malaise/fatigue  and diaphoresis.  HENT: Negative for hearing loss, ear pain, nosebleeds, congestion, sore throat, neck pain, tinnitus and ear discharge.   Eyes: Negative for blurred vision, double vision, photophobia, pain, discharge and redness.  Respiratory: Negative for cough, hemoptysis, sputum production, shortness of breath, wheezing and stridor.   Cardiovascular: Negative for chest pain, palpitations, orthopnea, claudication, leg swelling and PND.  Gastrointestinal: negative for abdominal pain. Negative for heartburn, nausea, vomiting, diarrhea, constipation, blood in stool and melena.  Genitourinary: Negative for dysuria, urgency, frequency, hematuria and flank pain.  Musculoskeletal: Negative for myalgias, back pain, joint pain and falls.  Skin: Negative for itching and rash.  Neurological: Negative for dizziness, tingling, tremors, sensory change, speech change, focal weakness, seizures, loss of consciousness, weakness and headaches.  Endo/Heme/Allergies: Negative for environmental allergies and polydipsia. Does not bruise/bleed easily.  Psychiatric/Behavioral: Negative for depression, suicidal ideas, hallucinations, memory loss and substance abuse. The patient is not nervous/anxious and does not have insomnia.        Objective:  Blood pressure 116/72, pulse 92, height 5\' 3"  (1.6 m), weight 181 lb (82.1 kg), last menstrual period 08/26/2020.   Physical Exam  Vitals reviewed. Constitutional: She is oriented to person, place, and time. She appears well-developed and well-nourished.  HENT:  Head: Normocephalic and atraumatic.        Right Ear: External ear normal.  Left Ear: External ear normal.  Nose: Nose normal.  Mouth/Throat: Oropharynx is clear and moist.  Eyes: Conjunctivae and EOM are normal. Pupils are equal, round, and reactive to light. Right eye exhibits no discharge. Left eye exhibits no discharge. No scleral icterus.  Neck: Normal range of motion. Neck supple. No tracheal deviation  present. No thyromegaly present.  Cardiovascular: Normal rate, regular rhythm, normal heart sounds and intact distal pulses.  Exam reveals no gallop and no friction rub.   No murmur heard. Respiratory: Effort normal and breath sounds normal. No respiratory distress. She has no wheezes. She has no rales. She exhibits no tenderness.  GI: Soft. Bowel sounds are normal. She exhibits no distension and no mass. There is no tenderness. There is no rebound and no guarding.  Genitourinary:  Breasts no masses skin changes or nipple changes bilaterally      Vulva is normal without lesions Vagina is pink moist without discharge Cervix normal in appearance and pap is done Uterus is normal size shape and contour Adnexa is negative with normal sized ovaries   Musculoskeletal: Normal range of motion.  She exhibits no edema and no tenderness.  Neurological: She is alert and oriented to person, place, and time. She has normal reflexes. She displays normal reflexes. No cranial nerve deficit. She exhibits normal muscle tone. Coordination normal.  Skin: Skin is warm and dry. No rash noted. No erythema. No pallor.  Psychiatric: She has a normal mood and affect. Her behavior is normal. Judgment and thought content normal.       Medications Ordered at today's visit: Meds ordered this encounter  Medications  . desogestrel-ethinyl estradiol (MIRCETTE) 0.15-0.02/0.01 MG (21/5) tablet    Sig: Take 1 tablet by mouth daily.    Dispense:  28 tablet    Refill:  12    This prescription was filled on 03/05/2020. Any refills authorized will be placed on file.    Other orders placed at today's visit: No orders of the defined types were placed in this encounter.     Assessment:    Normal Gyn exam.    Plan:    Contraception: OCP (estrogen/progesterone). Follow up in: based on HPV based cytology results years.     No follow-ups on file.

## 2020-09-07 ENCOUNTER — Telehealth: Payer: Self-pay | Admitting: Obstetrics & Gynecology

## 2020-09-07 NOTE — Telephone Encounter (Signed)
Returned pt's call. Corrected the pharmacy information in chart. Instructed pt to call the pharmacy and have them transfer it to the requested pharmacy since new prescriptions could not be sent again to a different pharmacy. Pt confirmed understanding.

## 2020-09-07 NOTE — Telephone Encounter (Signed)
Pt states we ordered Rx's at wrong pharmacy Please send Rx's ordered yesterday to [facility]/[place]  Please advise & call pt

## 2020-09-13 LAB — CYTOLOGY - PAP
Adequacy: ABSENT
Chlamydia: NEGATIVE
Comment: NEGATIVE
Comment: NEGATIVE
Comment: NORMAL
Diagnosis: NEGATIVE
High risk HPV: NEGATIVE
Neisseria Gonorrhea: NEGATIVE

## 2020-09-21 ENCOUNTER — Telehealth: Payer: Self-pay | Admitting: Obstetrics & Gynecology

## 2020-09-21 NOTE — Telephone Encounter (Signed)
Pt returned my call. Pt informed of negative pap smear results. Instructed pt to return every year for the physical and in 3 years for her next pap. Pt confirms understanding.

## 2020-09-21 NOTE — Telephone Encounter (Signed)
Patient called stating that she had some blood work done recently and she would like to know the results of her blood work. Patient states that she does not have good Internet so she can use my chart right now. Please contact pt

## 2020-09-21 NOTE — Telephone Encounter (Signed)
Returned pt's call, no answer, left vm 

## 2021-09-12 ENCOUNTER — Other Ambulatory Visit: Payer: Self-pay

## 2021-09-12 ENCOUNTER — Encounter: Payer: Self-pay | Admitting: Adult Health

## 2021-09-12 ENCOUNTER — Ambulatory Visit (INDEPENDENT_AMBULATORY_CARE_PROVIDER_SITE_OTHER): Payer: PRIVATE HEALTH INSURANCE | Admitting: Adult Health

## 2021-09-12 ENCOUNTER — Other Ambulatory Visit (HOSPITAL_COMMUNITY)
Admission: RE | Admit: 2021-09-12 | Discharge: 2021-09-12 | Disposition: A | Discharge status: Home / Self Care | Payer: PRIVATE HEALTH INSURANCE | Source: Ambulatory Visit | Attending: Adult Health | Admitting: Adult Health

## 2021-09-12 VITALS — BP 130/86 | HR 89 | Ht 63.0 in | Wt 185.0 lb

## 2021-09-12 DIAGNOSIS — Z113 Encounter for screening for infections with a predominantly sexual mode of transmission: Secondary | ICD-10-CM | POA: Diagnosis present

## 2021-09-12 DIAGNOSIS — Z3041 Encounter for surveillance of contraceptive pills: Secondary | ICD-10-CM | POA: Diagnosis not present

## 2021-09-12 DIAGNOSIS — Z01419 Encounter for gynecological examination (general) (routine) without abnormal findings: Secondary | ICD-10-CM | POA: Insufficient documentation

## 2021-09-12 DIAGNOSIS — G43109 Migraine with aura, not intractable, without status migrainosus: Secondary | ICD-10-CM

## 2021-09-12 MED ORDER — NORETHINDRONE 0.35 MG PO TABS: 1.0000 | ORAL_TABLET | Freq: Every day | ORAL | 11 refills | Status: DC

## 2021-09-12 NOTE — Progress Notes (Addendum)
Patient ID: Monique Terry, female   DOB: 1980/10/06, 41 y.o.   MRN: [id number] History of Present Illness: Monique Terry is a 41 year old white female,single, G1P0010, in for a well woman gyn exam.   Lab Results  Component Value Date   DIAGPAP  09/06/2020    - Negative for intraepithelial lesion or malignancy (NILM)   HPV NOT DETECTED 08/27/2017   HPVHIGH Negative 09/06/2020   PCP is Monique Rigg MD.  Current Medications, Allergies, Past Medical History, Past Surgical History, Family History and Social History were reviewed in [employer] medical record.     Review of Systems: Patient denies any daily headaches,more at time of period  hearing loss, fatigue, blurred vision, shortness of breath, chest pain, abdominal pain, problems with bowel movements, urination, or intercourse. No joint pain or mood swings.     Physical Exam:BP 130/86 (BP Location: Right Arm, Patient Position: Sitting, Cuff Size: Normal)    Pulse 89    Ht 5\' 3"  (1.6 m)    Wt 185 lb (83.9 kg)    BMI 32.77 kg/m   General:  Well developed, well nourished, no acute distress Skin:  Warm and dry Neck:  Midline trachea, normal thyroid, good ROM, no lymphadenopathy Lungs; Clear to auscultation bilaterally Breast:  No dominant palpable mass, retraction, or nipple discharge Cardiovascular: Regular rate and rhythm Abdomen:  Soft, non tender, no hepatosplenomegaly Pelvic:  External genitalia is normal in appearance, no lesions.  The vagina is normal in appearance. Urethra has no lesions or masses. The cervix is smooth, CV swab obtained.  Uterus is felt to be normal size, shape, and contour.  No adnexal masses or tenderness noted.Bladder is non tender, no masses felt. Extremities/musculoskeletal:  No swelling or varicosities noted, no clubbing or cyanosis Psych:  No mood changes, alert and cooperative,seems happy AA is 7 Fall risk is low Depression screen [facility] 2/9 09/12/2021 09/06/2020 08/29/2018  Decreased Interest 0 0 0   Down, Depressed, Hopeless 0 0 0  PHQ - 2 Score 0 0 0  Altered sleeping 1 1 -  Tired, decreased energy 1 1 -  Change in appetite 0 0 -  Feeling bad or failure about yourself  0 0 -  Trouble concentrating 0 1 -  Moving slowly or fidgety/restless 0 0 -  Suicidal thoughts 0 0 -  PHQ-9 Score 2 3 -    GAD 7 : Generalized Anxiety Score 09/12/2021 09/06/2020  Nervous, Anxious, on Edge 0 1  Control/stop worrying 0 0  Worry too much - different things 1 0  Trouble relaxing 1 1  Restless 1 1  Easily annoyed or irritable 0 1  Afraid - awful might happen 0 0  Total GAD 7 Score 3 4      Upstream - 09/12/21 1334       Pregnancy Intention Screening   Does the patient want to become pregnant in the next year? Unsure    Does the patient's partner want to become pregnant in the next year? Unsure    Would the patient like to discuss contraceptive options today? No      Contraception Wrap Up   Current Method Oral Contraceptive    End Method Oral Contraceptive    Contraception Counseling Provided No            Examination chaperoned by 09/14/21 RN  Impression and Plan:  1. Encounter for well woman exam with routine gynecological exam Physical in 1 year Pap in 2025 Labs with  PCP  2. Encounter for surveillance of contraceptive pills Finish current pack OCs and then start Micronor and use condoms for at least 1 pack Meds ordered this encounter  Medications   norethindrone (MICRONOR) 0.35 MG tablet    Sig: Take 1 tablet (0.35 mg total) by mouth daily.    Dispense:  28 tablet    Refill:  11    Order Specific Question:   Supervising Provider    Answer:   Monique Terry, LUTHER H [[id number]]     3. Migraine with aura and without status migrainosus, not intractable Will go to POP   4. STD screening CV swab sent for GC/CHL and trich

## 2021-09-12 NOTE — Addendum Note (Signed)
Addended by: Cyril Mourning A on: 09/12/2021 02:22 PM   Modules accepted: Orders

## 2021-09-14 LAB — CERVICOVAGINAL ANCILLARY ONLY
Chlamydia: NEGATIVE
Comment: NEGATIVE
Comment: NEGATIVE
Comment: NORMAL
Neisseria Gonorrhea: NEGATIVE
Trichomonas: NEGATIVE

## 2021-09-15 ENCOUNTER — Telehealth: Payer: Self-pay

## 2021-09-15 NOTE — Telephone Encounter (Signed)
-----   Message from Adline Potter, NP sent at 09/14/2021  3:38 PM EST ----- Let her know GC/CHL and trich all negative... THX

## 2021-09-15 NOTE — Telephone Encounter (Signed)
Called pt to relay test results, two identifiers used. Test results reviewed, all questions answered, and pt confirmed understanding.

## 2021-12-15 ENCOUNTER — Ambulatory Visit: Payer: BC Managed Care – PPO | Admitting: Adult Health

## 2021-12-15 ENCOUNTER — Encounter: Payer: Self-pay | Admitting: Adult Health

## 2021-12-15 VITALS — BP 141/90 | HR 73 | Ht 63.0 in | Wt 180.0 lb

## 2021-12-15 DIAGNOSIS — Z3041 Encounter for surveillance of contraceptive pills: Secondary | ICD-10-CM

## 2021-12-15 NOTE — Progress Notes (Signed)
  Subjective:     Patient ID: Monique Terry, female   DOB: [birth year], 41 y.o.   MRN: [id number]  HPI Monique Terry is a 41 year old white female,single, G1P0010, back in follow up on starting micronor, doing good, no periods and headaches better.  Lab Results  Component Value Date   DIAGPAP  09/06/2020    - Negative for intraepithelial lesion or malignancy (NILM)   HPV NOT DETECTED 08/27/2017   HPVHIGH Negative 09/06/2020   PCP is Dr Ladona Ridgel  Review of Systems No period with Micronor Headaches better  Having low back and had xrays,   Reviewed past medical,surgical, social and family history. Reviewed medications and allergies.  Objective:   Physical Exam BP (!) 141/90 (BP Location: Right Arm, Cuff Size: Normal)   Pulse 73   Ht 5\' 3"  (1.6 m)   Wt 180 lb (81.6 kg)   LMP  (LMP Unknown)   BMI 31.89 kg/m     Skin warm and dry. Lungs: clear to ausculation bilaterally. Cardiovascular: regular rate and rhythm.   Upstream - 12/15/21 1305       Pregnancy Intention Screening   Does the patient want to become pregnant in the next year? No    Does the patient's partner want to become pregnant in the next year? No    Would the patient like to discuss contraceptive options today? No      Contraception Wrap Up   Current Method Oral Contraceptive    End Method Oral Contraceptive    Contraception Counseling Provided No             Assessment:     1. Encounter for surveillance of contraceptive pills Continue Micronor, has refills    Plan:     Return in February for physical

## 2022-07-10 ENCOUNTER — Other Ambulatory Visit: Payer: Self-pay | Admitting: Adult Health

## 2022-09-24 ENCOUNTER — Ambulatory Visit (INDEPENDENT_AMBULATORY_CARE_PROVIDER_SITE_OTHER): Payer: BC Managed Care – PPO | Admitting: Adult Health

## 2022-09-24 ENCOUNTER — Encounter: Payer: Self-pay | Admitting: Adult Health

## 2022-09-24 VITALS — BP 134/87 | HR 83 | Ht 63.0 in | Wt 180.0 lb

## 2022-09-24 DIAGNOSIS — Z3041 Encounter for surveillance of contraceptive pills: Secondary | ICD-10-CM | POA: Diagnosis not present

## 2022-09-24 DIAGNOSIS — Z01419 Encounter for gynecological examination (general) (routine) without abnormal findings: Secondary | ICD-10-CM | POA: Diagnosis not present

## 2022-09-24 DIAGNOSIS — N6312 Unspecified lump in the right breast, upper inner quadrant: Secondary | ICD-10-CM | POA: Diagnosis not present

## 2022-09-24 DIAGNOSIS — G43109 Migraine with aura, not intractable, without status migrainosus: Secondary | ICD-10-CM

## 2022-09-24 MED ORDER — NORETHINDRONE 0.35 MG PO TABS: 1.0000 | ORAL_TABLET | Freq: Every day | ORAL | 11 refills | Status: DC

## 2022-09-24 NOTE — Progress Notes (Signed)
Patient ID: Monique Terry, female   DOB: 04-24-1981, 42 y.o.   MRN: [id number] History of Present Illness: Monique Terry is a 42 year old white female, with SO, G1P0010. In for a well woman gyn exam.  She has knot in right breast,she says she tripped in October or November over dog and hit wicker clothes basket and it bruised and has felt knot since.  Last pap was negative HPV,NILM 09/06/20  PCP is Dr Monique Terry.    Current Medications, Allergies, Past Medical History, Past Surgical History, Family History and Social History were reviewed in [employer] record.     Review of Systems: Patient denies any hearing loss, fatigue, blurred vision, shortness of breath, chest pain, abdominal pain, problems with bowel movements, urination, or intercourse. No joint pain or mood swings.  She says headaches better since taking Micronor.  +right breast knot   Physical Exam:BP 134/87 (BP Location: Left Arm, Patient Position: Sitting, Cuff Size: Normal)   Pulse 83   Ht '5\' 3"'$  (1.6 m)   Wt 180 lb (81.6 kg)   BMI 31.89 kg/m   General:  Well developed, well nourished, no acute distress Skin:  Warm and dry Neck:  Midline trachea, normal thyroid, good ROM, no lymphadenopathy Lungs; Clear to auscultation bilaterally Breast:  No dominant palpable mass, retraction, or nipple discharge, on the left, on the right, no retraction or nipple discharge, has pea sized mass at 2 o'clock 1 FB from areola.  Cardiovascular: Regular rate and rhythm Abdomen:  Soft, non tender, no hepatosplenomegaly Pelvic:  External genitalia is normal in appearance, no lesions.  The vagina is normal in appearance. Urethra has no lesions or masses. The cervix is smooth.  Uterus is felt to be normal size, shape, and contour.  No adnexal masses or tenderness noted.Bladder is non tender, no masses felt. Extremities/musculoskeletal:  No swelling or varicosities noted, no clubbing or cyanosis Psych:  No mood changes, alert  and cooperative,seems happy AA is 5  Fall risk is moderate    09/24/2022    3:19 PM 09/12/2021    1:34 PM 09/06/2020    3:50 PM  Depression screen PHQ 2/9  Decreased Interest 1 0 0  Down, Depressed, Hopeless 0 0 0  PHQ - 2 Score 1 0 0  Altered sleeping '1 1 1  '$ Tired, decreased energy '1 1 1  '$ Change in appetite 1 0 0  Feeling bad or failure about yourself  0 0 0  Trouble concentrating 0 0 1  Moving slowly or fidgety/restless 0 0 0  Suicidal thoughts 0 0 0  PHQ-9 Score '4 2 3   '$ On lexapro     09/24/2022    3:20 PM 09/12/2021    1:34 PM 09/06/2020    3:50 PM  GAD 7 : Generalized Anxiety Score  Nervous, Anxious, on Edge 2 0 1  Control/stop worrying 1 0 0  Worry too much - different things 1 1 0  Trouble relaxing '3 1 1  '$ Restless '3 1 1  '$ Easily annoyed or irritable 2 0 1  Afraid - awful might happen 0 0 0  Total GAD 7 Score '12 3 4    '$ Upstream - 09/24/22 1530       Pregnancy Intention Screening   Does the patient want to become pregnant in the next year? No    Does the patient's partner want to become pregnant in the next year? No    Would the patient like to discuss contraceptive options  today? No      Contraception Wrap Up   Current Method Oral Contraceptive    End Method Oral Contraceptive              Examination chaperoned by Levy Pupa LPN   Impression and Plan: 1. Encounter for well woman exam with routine gynecological exam Pap and physical in 1 year Labs with PCP  2. Encounter for surveillance of contraceptive pills Happy with Micronor, will refill Meds ordered this encounter  Medications   norethindrone (MICRONOR) 0.35 MG tablet    Sig: Take 1 tablet (0.35 mg total) by mouth daily.    Dispense:  28 tablet    Refill:  11    This prescription was filled on 05/01/2022. Any refills authorized will be placed on file.    Order Specific Question:   Supervising Provider    Answer:   Tania Ade H [[id number]]     3. Mass of upper inner quadrant of right breast Has  pea sized mass at 2 0'clock 1 FB from areola, fell in October or November and hit wicker clothes basket Scheduled diagnostic mammogram and [place] at [facility] for 10/09/22 at 3 pm  - US BREAST LTD UNI RIGHT INC AXILLA; Future - MM DIAG BREAST TOMO BILATERAL; Future - US BREAST LTD UNI LEFT INC AXILLA; Future  4. Migraine with aura and without status migrainosus, not intractable Better since on Micronor, is meds from PCP

## 2022-10-09 ENCOUNTER — Ambulatory Visit (HOSPITAL_COMMUNITY)
Admission: RE | Admit: 2022-10-09 | Discharge: 2022-10-09 | Disposition: A | Discharge status: Home / Self Care | Payer: BC Managed Care – PPO | Source: Ambulatory Visit | Attending: Adult Health | Admitting: Adult Health

## 2022-10-09 DIAGNOSIS — N6312 Unspecified lump in the right breast, upper inner quadrant: Secondary | ICD-10-CM | POA: Diagnosis not present

## 2023-09-25 ENCOUNTER — Ambulatory Visit (INDEPENDENT_AMBULATORY_CARE_PROVIDER_SITE_OTHER): Payer: Self-pay | Admitting: Adult Health

## 2023-09-25 ENCOUNTER — Other Ambulatory Visit (HOSPITAL_COMMUNITY)
Admission: RE | Admit: 2023-09-25 | Discharge: 2023-09-25 | Disposition: A | Discharge status: Home / Self Care | Source: Ambulatory Visit | Attending: Adult Health | Admitting: Adult Health

## 2023-09-25 ENCOUNTER — Encounter: Payer: Self-pay | Admitting: Adult Health

## 2023-09-25 VITALS — BP 131/88 | HR 76 | Ht 63.0 in | Wt 187.0 lb

## 2023-09-25 DIAGNOSIS — Z3041 Encounter for surveillance of contraceptive pills: Secondary | ICD-10-CM

## 2023-09-25 DIAGNOSIS — Z01419 Encounter for gynecological examination (general) (routine) without abnormal findings: Secondary | ICD-10-CM | POA: Diagnosis present

## 2023-09-25 DIAGNOSIS — Z1331 Encounter for screening for depression: Secondary | ICD-10-CM | POA: Diagnosis not present

## 2023-09-25 MED ORDER — NORETHINDRONE 0.35 MG PO TABS: 1.0000 | ORAL_TABLET | Freq: Every day | ORAL | 4 refills | Status: DC

## 2023-09-25 NOTE — Progress Notes (Signed)
 Patient ID: Monique Terry, female   DOB: 1981-06-08, 43 y.o.   MRN: [id number] History of Present Illness: Monique Terry is a 43 year old white female, single, G1P0010, in for a well woman gyn exam and pap.  PCP is August Olinger NP   Current Medications, Allergies, Past Medical History, Past Surgical History, Family History and Social History were reviewed in [employer] record.     Review of Systems: Patient denies any hearing loss, fatigue, blurred vision, shortness of breath, chest pain, abdominal pain, problems with bowel movements, urination, or intercourse(not active). No joint pain or mood swings.  Has hx migraine with aura Periods not heavy on micronor   Physical Exam:BP 131/88 (BP Location: Left Arm, Patient Position: Sitting, Cuff Size: Normal)   Pulse 76   Ht 5\' 3"  (1.6 m)   Wt 187 lb (84.8 kg)   LMP 09/25/2023   BMI 33.13 kg/m   General:  Well developed, well nourished, no acute distress Skin:  Warm and dry Neck:  Midline trachea, normal thyroid, good ROM, no lymphadenopathy Lungs; Clear to auscultation bilaterally Breast:  No dominant palpable mass, retraction, or nipple discharge Cardiovascular: Regular rate and rhythm Abdomen:  Soft, non tender, no hepatosplenomegaly Pelvic:  External genitalia is normal in appearance, no lesions.  The vagina is normal in appearance, +period blood. Urethra has no lesions or masses. The cervix is smooth, pap with HR HPV genotyping.  Uterus is felt to be normal size, shape, and contour.  No adnexal masses or tenderness noted.Bladder is non tender, no masses felt. Rectal: Deferred  Extremities/musculoskeletal:  No swelling or varicosities noted, no clubbing or cyanosis Psych:  No mood changes, alert and cooperative,seems happy AA is 6 Fall risk is low    09/25/2023   10:38 AM 09/24/2022    3:19 PM 09/12/2021    1:34 PM  Depression screen PHQ 2/9  Decreased Interest 1 1 0  Down, Depressed, Hopeless 1 0 0  PHQ - 2  Score 2 1 0  Altered sleeping 2 1 1   Tired, decreased energy 1 1 1   Change in appetite 2 1 0  Feeling bad or failure about yourself  0 0 0  Trouble concentrating 1 0 0  Moving slowly or fidgety/restless 2 0 0  Suicidal thoughts 0 0 0  PHQ-9 Score 10 4 2    On meds    09/25/2023   10:38 AM 09/24/2022    3:20 PM 09/12/2021    1:34 PM 09/06/2020    3:50 PM  GAD 7 : Generalized Anxiety Score  Nervous, Anxious, on Edge 1 2 0 1  Control/stop worrying 1 1 0 0  Worry too much - different things 1 1 1  0  Trouble relaxing 2 3 1 1   Restless 3 3 1 1   Easily annoyed or irritable 2 2 0 1  Afraid - awful might happen 1 0 0 0  Total GAD 7 Score 11 12 3 4       Upstream - 09/25/23 1047       Pregnancy Intention Screening   Does the patient want to become pregnant in the next year? No    Does the patient's partner want to become pregnant in the next year? No    Would the patient like to discuss contraceptive options today? No      Contraception Wrap Up   Current Method Oral Contraceptive    End Method Oral Contraceptive    Contraception Counseling Provided Yes  Examination chaperoned by Malachy Mood LPN  Impression and plan: 1. Encounter for gynecological examination with Papanicolaou smear of cervix (Primary) Pap sent Pap in 3 years if normal Physical in 1 year Labs with PCP Call to get mammogram  - Cytology - PAP( [place])  2. Encounter for surveillance of contraceptive pills Periods not heavy with Micronor Refill micronor Meds ordered this encounter  Medications   norethindrone (MICRONOR) 0.35 MG tablet    Sig: Take 1 tablet (0.35 mg total) by mouth daily.    Dispense:  84 tablet    Refill:  4    This prescription was filled on 05/01/2022. Any refills authorized will be placed on file.    Supervising Provider:   Lazaro Arms [[id number]]

## 2023-09-30 LAB — CYTOLOGY - PAP
Adequacy: ABSENT
Comment: NEGATIVE
Diagnosis: NEGATIVE
Diagnosis: REACTIVE
High risk HPV: NEGATIVE

## 2024-07-25 ENCOUNTER — Other Ambulatory Visit: Payer: Self-pay | Admitting: Adult Health

## 2024-07-27 ENCOUNTER — Telehealth: Payer: Self-pay | Admitting: Adult Health

## 2024-07-27 NOTE — Telephone Encounter (Signed)
Pt aware refill was sent to pharmacy. JSY 

## 2024-07-27 NOTE — Telephone Encounter (Signed)
 Patient called scheduled annual but not until 03/02 needs b/c script to last until then to be sent to [facility] on [facility] [place]

## 2024-09-28 ENCOUNTER — Ambulatory Visit: Admitting: Adult Health
